# Patient Record
Sex: Female | Born: 1975
Health system: Southern US, Community
[De-identification: ages and names within clinical notes are randomized; demographics above are authoritative.]

## PROBLEM LIST (undated history)

## (undated) DIAGNOSIS — K900 Celiac disease: Secondary | ICD-10-CM

## (undated) HISTORY — PX: ABDOMINAL HYSTERECTOMY: SHX81

## (undated) HISTORY — DX: Celiac disease: K90.0

---

## 2012-04-19 ENCOUNTER — Ambulatory Visit (INDEPENDENT_AMBULATORY_CARE_PROVIDER_SITE_OTHER): Payer: 59 | Admitting: Physician Assistant

## 2012-04-19 VITALS — BP 110/76 | HR 77 | Temp 98.7°F | Resp 16 | Ht 65.0 in | Wt 140.4 lb

## 2012-04-19 DIAGNOSIS — L282 Other prurigo: Secondary | ICD-10-CM

## 2012-04-19 DIAGNOSIS — B379 Candidiasis, unspecified: Secondary | ICD-10-CM

## 2012-04-19 LAB — POCT SKIN KOH: Skin KOH, POC: POSITIVE

## 2012-04-19 MED ORDER — NYSTATIN 100000 UNIT/GM EX POWD: Freq: Four times a day (QID) | CUTANEOUS | Status: AC

## 2012-04-19 NOTE — Progress Notes (Signed)
  Subjective:    Patient ID: Angela May, female    DOB: April 06, 1976, 36 y.o.   MRN: [id number]  HPI Patient presents with 10 days of pruritic rash under breasts and around waist line. Describes as red, splotchy, extremely pruritic, and worsening. She has been using hydrocortisone cream which did not help so she switched to benadryl cream and oral benadryl at night. These have done nothing to help her rash. It is spreading around to her back along her bra line as well as her waist line.  No warmth, drainage, or tenderness.     Review of Systems  All other systems reviewed and are negative.       Objective:   Physical Exam  Constitutional: She is oriented to person, place, and time. She appears well-developed and well-nourished.  HENT:  Head: Normocephalic and atraumatic.  Right Ear: External ear normal.  Left Ear: External ear normal.  Mouth/Throat: No oropharyngeal exudate.  Eyes: Conjunctivae are normal.  Neck: Normal range of motion.  Neurological: She is alert and oriented to person, place, and time.  Skin:     Psychiatric: She has a normal mood and affect. Her behavior is normal. Judgment and thought content normal.    Results for orders placed in visit on 04/19/12  POCT SKIN KOH      Component Value Range   Skin KOH, POC Positive           Assessment & Plan:   1. Candidiasis  Nystatin powder bid-tid x 2 weeks Call if no improvement in 3-4 days and can switch to clotrimazole cream.    2. Pruritic rash  POCT Skin KOH

## 2012-04-22 ENCOUNTER — Telehealth: Payer: Self-pay

## 2012-04-22 MED ORDER — CLOTRIMAZOLE 1 % EX CREA: TOPICAL_CREAM | Freq: Two times a day (BID) | CUTANEOUS | Status: AC

## 2012-04-22 NOTE — Telephone Encounter (Signed)
LMOM to pick up RX from pharmacy. 

## 2012-04-22 NOTE — Telephone Encounter (Signed)
Rx sent to pharmacy   

## 2012-04-22 NOTE — Telephone Encounter (Signed)
PATIENT STATES SHE SAW HEATHER ON Tuesday FOR A YEAST RASH. SHE GAVE HER A POWDER TO USE AND SAID IF THAT DID NOT WORK TO CALL HER BACK AND SHE WOULD PRESCRIBE HER A CREAM. SHE WOULD LIKE TO HAVE THAT CALLED INTO THE PHARMACY BECAUSE THE POWDER IS NOT HELPING HER ITCH AT ALL. BEST PHONE [phone] (CELL)  PHARMACY CHOICE IS [facility] IN [place] OFF OF [address].   MBC

## 2015-10-24 ENCOUNTER — Ambulatory Visit (INDEPENDENT_AMBULATORY_CARE_PROVIDER_SITE_OTHER): Payer: BLUE CROSS/BLUE SHIELD | Admitting: Emergency Medicine

## 2015-10-24 VITALS — BP 108/78 | HR 88 | Temp 98.1°F | Resp 16 | Ht 69.0 in | Wt 148.0 lb

## 2015-10-24 DIAGNOSIS — B085 Enteroviral vesicular pharyngitis: Secondary | ICD-10-CM | POA: Diagnosis not present

## 2015-10-24 DIAGNOSIS — J209 Acute bronchitis, unspecified: Secondary | ICD-10-CM | POA: Diagnosis not present

## 2015-10-24 MED ORDER — AMOXICILLIN-POT CLAVULANATE 875-125 MG PO TABS: 1.0000 | ORAL_TABLET | Freq: Two times a day (BID) | ORAL | Status: DC

## 2015-10-24 MED ORDER — HYDROCOD POLST-CPM POLST ER 10-8 MG/5ML PO SUER: 5.0000 mL | Freq: Two times a day (BID) | ORAL | Status: DC

## 2015-10-24 NOTE — Progress Notes (Signed)
Subjective:  Patient ID: Angela May, female    DOB: [birth year]  Age: 40 y.o. MRN: [id number]  CC: Sore Throat   HPI Angela May presents  patient has a sore throat cough. She has no fever or chills. She has no nasal congestion postnasal drainage. She has a cough that's productive purulent sputum. She's had a fever at times at home denies any nausea vomiting. No wheezing or shortness of breath. She had no improvement with over-the-counter medication.  History Angela May has no past medical history on file.   She has past surgical history that includes Abdominal hysterectomy.   Her  family history is not on file.  She   reports that she has never smoked. She does not have any smokeless tobacco history on file. Her alcohol and drug histories are not on file.  Outpatient Prescriptions Prior to Visit  Medication Sig Dispense Refill  . amitriptyline (ELAVIL) 10 MG tablet Take 10 mg by mouth at bedtime.    . topiramate (TOPAMAX) 25 MG tablet Take 25 mg by mouth 2 (two) times daily.     No facility-administered medications prior to visit.    Social History   Social History  . Marital Status: Divorced    Spouse Name: N/A  . Number of Children: N/A  . Years of Education: N/A   Social History Main Topics  . Smoking status: Never Smoker   . Smokeless tobacco: None  . Alcohol Use: None  . Drug Use: None  . Sexual Activity: Not Asked   Other Topics Concern  . None   Social History Narrative     Review of Systems  Constitutional: Negative for fever, chills and appetite change.  HENT: Positive for sore throat. Negative for congestion, ear pain, postnasal drip and sinus pressure.   Eyes: Negative for pain and redness.  Respiratory: Positive for cough. Negative for shortness of breath and wheezing.   Cardiovascular: Negative for leg swelling.  Gastrointestinal: Negative for nausea, vomiting, abdominal pain, diarrhea, constipation and blood in stool.  Endocrine: Negative for  polyuria.  Genitourinary: Negative for dysuria, urgency, frequency and flank pain.  Musculoskeletal: Negative for gait problem.  Skin: Negative for rash.  Neurological: Negative for weakness and headaches.  Psychiatric/Behavioral: Negative for confusion and decreased concentration. The patient is not nervous/anxious.     Objective:  BP 108/78 mmHg  Pulse 88  Temp(Src) 98.1 F (36.7 C) (Oral)  Resp 16  Ht 5\' 9"  (1.753 m)  Wt 148 lb (67.132 kg)  BMI 21.85 kg/m2  SpO2 99%  Physical Exam  Constitutional: She is oriented to person, place, and time. She appears well-developed and well-nourished. No distress.  HENT:  Head: Normocephalic and atraumatic.  Right Ear: External ear normal.  Left Ear: External ear normal.  Nose: Nose normal.  She has multiple small discrete erythematous lesions on back or throat there is no generalized erythema. She has no exudate or tonsillar enlargement.  Eyes: Conjunctivae and EOM are normal. Pupils are equal, round, and reactive to light. No scleral icterus.  Neck: Normal range of motion. Neck supple. No tracheal deviation present.  Cardiovascular: Normal rate, regular rhythm and normal heart sounds.   Pulmonary/Chest: Effort normal. No respiratory distress. She has no wheezes. She has no rales.  Abdominal: She exhibits no mass. There is no tenderness. There is no rebound and no guarding.  Musculoskeletal: She exhibits no edema.  Lymphadenopathy:    She has no cervical adenopathy.  Neurological: She is alert and oriented to person,  place, and time. Coordination normal.  Skin: Skin is warm and dry. No rash noted.  Psychiatric: She has a normal mood and affect. Her behavior is normal.      Assessment & Plan:   Angela May was seen today for sore throat.  Diagnoses and all orders for this visit:  Acute bronchitis, unspecified organism  Acute pharyngitis due to coxsackie virus  Other orders -     chlorpheniramine-HYDROcodone (TUSSIONEX PENNKINETIC  ER) 10-8 MG/5ML SUER; Take 5 mLs by mouth 2 (two) times daily. -     amoxicillin-clavulanate (AUGMENTIN) 875-125 MG tablet; Take 1 tablet by mouth 2 (two) times daily.   I am having Angela May start on chlorpheniramine-HYDROcodone and amoxicillin-clavulanate. I am also having her maintain her topiramate, amitriptyline, and LORazepam.  Meds ordered this encounter  Medications  . LORazepam (ATIVAN) 0.5 MG tablet    Sig: Take 0.5 mg by mouth every 8 (eight) hours.  . chlorpheniramine-HYDROcodone (TUSSIONEX PENNKINETIC ER) 10-8 MG/5ML SUER    Sig: Take 5 mLs by mouth 2 (two) times daily.    Dispense:  60 mL    Refill:  0  . amoxicillin-clavulanate (AUGMENTIN) 875-125 MG tablet    Sig: Take 1 tablet by mouth 2 (two) times daily.    Dispense:  20 tablet    Refill:  0    Appropriate red flag conditions were discussed with the patient as well as actions that should be taken.  Patient expressed his understanding.  Follow-up: Return if symptoms worsen or fail to improve.  Angela May, Janice Seales S, MD

## 2015-10-24 NOTE — Patient Instructions (Signed)

## 2015-11-01 ENCOUNTER — Telehealth: Payer: Self-pay

## 2015-11-01 MED ORDER — FLUCONAZOLE 150 MG PO TABS: 150.0000 mg | ORAL_TABLET | Freq: Once | ORAL | Status: DC

## 2015-11-01 NOTE — Telephone Encounter (Signed)
Yes.  150 mg once.  #2. Repeat in three days if necessary.  Deliah BostonMichael Dakia Schifano, MS, PA-C 11:19 AM, 11/01/2015

## 2015-11-01 NOTE — Telephone Encounter (Signed)
Pt was prescribed Augmentin on 10/24/15 by Dr. Dareen PianoAnderson. Pt says she now has a yeast infection. Pt wondering if we can send in Diflucan?

## 2015-11-01 NOTE — Telephone Encounter (Signed)
Thank you. Will send in and inform pt medication has been sent.

## 2015-11-05 ENCOUNTER — Telehealth: Payer: Self-pay | Admitting: Family Medicine

## 2015-11-05 NOTE — Telephone Encounter (Signed)
Patient called, was seen 10/24/15 dx with acute bronchitis and prescribed augmentin. She is now able to swallow and her throat is no longer sore, other than that she is not any better. Having chills, cough/non-productive, fever (101), and "dont feel good". She has also started having body aches. Advised RTC. She stated she will come in the am 11/06/15.

## 2015-11-06 ENCOUNTER — Ambulatory Visit (INDEPENDENT_AMBULATORY_CARE_PROVIDER_SITE_OTHER): Payer: BLUE CROSS/BLUE SHIELD

## 2015-11-06 ENCOUNTER — Ambulatory Visit (INDEPENDENT_AMBULATORY_CARE_PROVIDER_SITE_OTHER): Payer: BLUE CROSS/BLUE SHIELD | Admitting: Urgent Care

## 2015-11-06 ENCOUNTER — Telehealth: Payer: Self-pay | Admitting: Family Medicine

## 2015-11-06 VITALS — BP 102/68 | HR 83 | Temp 98.6°F | Resp 16 | Ht 69.0 in | Wt 146.0 lb

## 2015-11-06 DIAGNOSIS — J209 Acute bronchitis, unspecified: Secondary | ICD-10-CM

## 2015-11-06 DIAGNOSIS — R062 Wheezing: Secondary | ICD-10-CM

## 2015-11-06 DIAGNOSIS — R059 Cough, unspecified: Secondary | ICD-10-CM

## 2015-11-06 DIAGNOSIS — R05 Cough: Secondary | ICD-10-CM

## 2015-11-06 MED ORDER — AZITHROMYCIN 250 MG PO TABS: ORAL_TABLET | ORAL | Status: DC

## 2015-11-06 MED ORDER — BENZONATATE 100 MG PO CAPS: 100.0000 mg | ORAL_CAPSULE | Freq: Three times a day (TID) | ORAL | Status: DC | PRN

## 2015-11-06 MED ORDER — HYDROCOD POLST-CPM POLST ER 10-8 MG/5ML PO SUER: 5.0000 mL | Freq: Every evening | ORAL | Status: DC | PRN

## 2015-11-06 MED ORDER — FLUCONAZOLE 150 MG PO TABS: 150.0000 mg | ORAL_TABLET | Freq: Once | ORAL | Status: DC

## 2015-11-06 MED ORDER — ALBUTEROL SULFATE HFA 108 (90 BASE) MCG/ACT IN AERS: 2.0000 | INHALATION_SPRAY | Freq: Four times a day (QID) | RESPIRATORY_TRACT | Status: DC | PRN

## 2015-11-06 NOTE — Telephone Encounter (Signed)
Patient notified by phone, see results note from 11/06/2015.

## 2015-11-06 NOTE — Patient Instructions (Signed)

## 2015-11-06 NOTE — Telephone Encounter (Signed)
Erskine Squibb with [facility] called report for CXR. See report.  Lingula versus anterior basal segment left lower lobe airspace opacity compatible with bronchopneumonia in this setting. No pleural effusion.  Followup PA and lateral chest X-ray is recommended in 3-4 weeks following trial of antibiotic therapy to ensure resolution and exclude underlying malignancy.  These results will be called to the ordering clinician or representative by the Radiologist Assistant, and communication documented in the PACS or zVision Dashboard.

## 2015-11-06 NOTE — Progress Notes (Signed)
    MRN: [id number] DOB: 07/09/1976  Subjective:   Angela May is a 40 y.o. female presenting for chief complaint of Follow-up; Fever; Fatigue; and Chills  Reports 3 week history of persistent cough, fatigue, chills, fever (highest 100.17F). Patient was seen 10/24/2015 for similar symptoms and sore throat. She was diagnosed with pharyngitis and bronchitis, started on Augmentin and Tussionex. She has since had improvement in her sore throat but is worried that her symptoms have not yet resolved. 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 Denies chest pain, shob, n/v, abdominal pain, body aches. Admits history of seasonal allergies, is not compliant with her Claritin and Zyrtec. Denies history of asthma. Denies smoking cigarettes.  Quentina has a current medication list which includes the following prescription(s): amitriptyline, lorazepam, topiramate, amoxicillin-clavulanate, chlorpheniramine-hydrocodone, and fluconazole. Also has No Known Allergies.  Eliyanah  has no past medical history on file. Also  has past surgical history that includes Abdominal hysterectomy.  Objective:   Vitals: BP 102/68 mmHg  Pulse 83  Temp(Src) 98.6 F (37 C) (Oral)  Resp 16  Ht  (1.753 m)  Wt 146 lb (66.225 kg)  BMI 21.55 kg/m2  SpO2 98%  Physical Exam  Constitutional: She is oriented to person, place, and time. She appears well-developed and well-nourished.  HENT:  TM's intact bilaterally, no effusions or erythema. Nasal turbinates pink and moist. No sinus tenderness. Mild postnasal drip present, without oropharyngeal exudates, erythema or abscesses.  Eyes: Right eye exhibits no discharge. Left eye exhibits no discharge. No scleral icterus.  Neck: Normal range of motion. Neck supple.  Cardiovascular: Normal rate, regular rhythm and intact distal pulses.  Exam reveals no gallop and no friction rub.   No murmur heard. Pulmonary/Chest: No respiratory distress. She has wheezes  (bibasilar wheezing). She has no rales.  Lymphadenopathy:    She has no cervical adenopathy.  Neurological: She is alert and oriented to person, place, and time.  Skin: Skin is warm and dry.   [facility] reading (PRIMARY) by PA-Daysean Tinkham. Chest - possible increased marking in right lower field, please comment.  Assessment and Plan :   1. Acute bronchitis, unspecified organism 2. Cough 3. Wheezing - Radiology over-read pending. Will cover patient for infectious process with Azithromycin, refilled Tussionex. Also provided with albuterol and Tessalon. Diflucan for antibiotic associated yeast infection. RTC in 2 weeks if no improvement.  Wallis Bamberg, PA-C [facility] [phone] 11/06/2015 11:02 AM

## 2016-06-08 ENCOUNTER — Encounter: Payer: Self-pay | Admitting: Gastroenterology

## 2016-08-17 ENCOUNTER — Encounter: Payer: Self-pay | Admitting: Gastroenterology

## 2016-08-17 ENCOUNTER — Ambulatory Visit (INDEPENDENT_AMBULATORY_CARE_PROVIDER_SITE_OTHER): Payer: BLUE CROSS/BLUE SHIELD | Admitting: Gastroenterology

## 2016-08-17 ENCOUNTER — Other Ambulatory Visit (INDEPENDENT_AMBULATORY_CARE_PROVIDER_SITE_OTHER): Payer: BLUE CROSS/BLUE SHIELD

## 2016-08-17 VITALS — BP 102/64 | HR 80 | Ht 68.0 in | Wt 143.4 lb

## 2016-08-17 DIAGNOSIS — K900 Celiac disease: Secondary | ICD-10-CM | POA: Diagnosis not present

## 2016-08-17 LAB — CBC WITH DIFFERENTIAL/PLATELET
BASOS ABS: 0 10*3/uL (ref 0.0–0.1)
BASOS PCT: 0.6 % (ref 0.0–3.0)
EOS ABS: 0 10*3/uL (ref 0.0–0.7)
Eosinophils Relative: 0.7 % (ref 0.0–5.0)
HEMATOCRIT: 45.3 % (ref 36.0–46.0)
Hemoglobin: 15.8 g/dL — ABNORMAL HIGH (ref 12.0–15.0)
LYMPHS ABS: 1.3 10*3/uL (ref 0.7–4.0)
LYMPHS PCT: 18.5 % (ref 12.0–46.0)
MCHC: 34.9 g/dL (ref 30.0–36.0)
MCV: 92.7 fl (ref 78.0–100.0)
Monocytes Absolute: 0.3 10*3/uL (ref 0.1–1.0)
Monocytes Relative: 3.5 % (ref 3.0–12.0)
NEUTROS ABS: 5.6 10*3/uL (ref 1.4–7.7)
NEUTROS PCT: 76.7 % (ref 43.0–77.0)
PLATELETS: 233 10*3/uL (ref 150.0–400.0)
RBC: 4.89 Mil/uL (ref 3.87–5.11)
RDW: 11.9 % (ref 11.5–15.5)
WBC: 7.3 10*3/uL (ref 4.0–10.5)

## 2016-08-17 LAB — COMPREHENSIVE METABOLIC PANEL
ALT: 14 U/L (ref 0–35)
AST: 13 U/L (ref 0–37)
Albumin: 4.5 g/dL (ref 3.5–5.2)
Alkaline Phosphatase: 47 U/L (ref 39–117)
BUN: 13 mg/dL (ref 6–23)
CALCIUM: 9.6 mg/dL (ref 8.4–10.5)
CHLORIDE: 108 meq/L (ref 96–112)
CO2: 24 meq/L (ref 19–32)
Creatinine, Ser: 0.93 mg/dL (ref 0.40–1.20)
GFR: 71.41 mL/min (ref >60.00)
GLUCOSE: 103 mg/dL — AB (ref 70–99)
Potassium: 5.1 mEq/L (ref 3.5–5.1)
Sodium: 139 mEq/L (ref 135–145)
Total Bilirubin: 0.4 mg/dL (ref 0.2–1.2)
Total Protein: 7.5 g/dL (ref 6.0–8.3)

## 2016-08-17 LAB — IBC PANEL
Iron: 99 ug/dL (ref 42–145)
SATURATION RATIOS: 41.1 % (ref 20.0–50.0)
TRANSFERRIN: 172 mg/dL — AB (ref 212.0–360.0)

## 2016-08-17 LAB — VITAMIN D 25 HYDROXY (VIT D DEFICIENCY, FRACTURES): VITD: 36.95 ng/mL (ref 30.00–100.00)

## 2016-08-17 LAB — CALCIUM: CALCIUM: 9.6 mg/dL (ref 8.4–10.5)

## 2016-08-17 LAB — FERRITIN: FERRITIN: 46.8 ng/mL (ref 10.0–291.0)

## 2016-08-17 NOTE — Patient Instructions (Addendum)
We will get records sent from your previous gastroenterologist in [facility].  This will include any endoscopic (colonoscopy or upper endoscopy) procedures and any associated pathology reports.   You will have labs checked today in the basement lab.  Please head down after you check out with the front desk  (tTG level, cbc, cmet, vit D, calcium, ferritin, iron, tibc). Dietary referral for celiac sprue.  Please call [phone] if you have not heard from that office in 1 week.  Please return to see Dr. Christella HartiganJacobs in 6 months.  Please call to set up appointment in about 2 months. Start one imodium once every morning.

## 2016-08-17 NOTE — Progress Notes (Signed)
HPI: This is a    very pleasant 40 year old woman   who was [facility] to [facility] Braxton Feathersavvon.   Chief complaint is celiac sprue  [place]; and EGD and colonoscopy.  She's been having terrible abd pains, lower abd. With worse loose stools.  She tries be compliant with gluten free  Occasional beer.  Eats a lot of salads.  She will have 4-5 BMs daily, always loose.  Never bloody.  Overall stable weight.  Never had dietician   2-3 coffees daily.     Review of systems: Pertinent positive and negative review of systems were noted in the above HPI section. Complete review of systems was performed and was otherwise normal.   Past Medical History:  Diagnosis Date  . Celiac disease    DX 2009    Past Surgical History:  Procedure Laterality Date  . ABDOMINAL HYSTERECTOMY      Current Outpatient Prescriptions  Medication Sig Dispense Refill  . amitriptyline (ELAVIL) 10 MG tablet Take 10 mg by mouth at bedtime.    Marland Kitchen. LORazepam (ATIVAN) 0.5 MG tablet Take 0.5 mg by mouth every 8 (eight) hours. Reported on 11/06/2015    . topiramate (TOPAMAX) 25 MG tablet Take 25 mg by mouth 2 (two) times daily.     No current facility-administered medications for this visit.     Allergies as of 08/17/2016  . (No Known Allergies)    Family History  Problem Relation Age of Onset  . Colon cancer Neg Hx   . Stomach cancer Neg Hx     Social History   Social History  . Marital status: Divorced    Spouse name: N/A  . Number of children: N/A  . Years of education: N/A   Occupational History  . Not on file.   Social History Main Topics  . Smoking status: Never Smoker  . Smokeless tobacco: Not on file  . Alcohol use Not on file  . Drug use: Unknown  . Sexual activity: Not on file   Other Topics Concern  . Not on file   Social History Narrative  . No narrative on file     Physical Exam: Ht 5\' 8"  (1.727 m)   Wt 143  lb 6 oz (65 kg)   BMI 21.80 kg/m  Constitutional: generally well-appearing Psychiatric: alert and oriented x3 Eyes: extraocular movements intact Mouth: oral pharynx moist, no lesions Neck: supple no lymphadenopathy Cardiovascular: heart regular rate and rhythm Lungs: clear to auscultation bilaterally Abdomen: soft, nontender, nondistended, no obvious ascites, no peritoneal signs, normal bowel sounds Extremities: no lower extremity edema bilaterally Skin: no lesions on visible extremities   Assessment and plan: 40 y.o. female with  previously diagnosed celiac sprue  Chronic loose stools, intermittent lower abdominal pains. She knows she is not was compliant with gluten-free diet but I suspect some gluten is still getting through even when she tries to avoid it. She is going to get a basic set of labs today including TTg levels, iron studies, vitamin D calcium levels.   She has never set down the dietitian formally and we are going to arrange for that referral. She will return to see me in 6 months and sooner if needed. I see no reason for any endoscopic testing. We will send away for her upper endoscopy, colonoscopy and associated pathology reports from [facility] where she was originally diagnosed with the celiac sprue.   Rob Buntinganiel Aydn Ferrara, MD [facility] 08/17/2016, 8:32 AM  Cc: Olivia Mackieichard Taavon, MD

## 2016-08-18 LAB — TISSUE TRANSGLUTAMINASE, IGA: Tissue Transglutaminase Ab, IgA: 1 U/mL (ref <4)

## 2016-08-20 ENCOUNTER — Other Ambulatory Visit: Payer: Self-pay

## 2016-08-20 DIAGNOSIS — K909 Intestinal malabsorption, unspecified: Secondary | ICD-10-CM

## 2016-08-25 ENCOUNTER — Other Ambulatory Visit (INDEPENDENT_AMBULATORY_CARE_PROVIDER_SITE_OTHER): Payer: BLUE CROSS/BLUE SHIELD

## 2016-08-25 DIAGNOSIS — K909 Intestinal malabsorption, unspecified: Secondary | ICD-10-CM | POA: Diagnosis not present

## 2016-08-25 LAB — IGA: IGA: 240 mg/dL (ref 68–378)

## 2016-08-27 LAB — CELIAC PANEL 10
ANTIGLIADIN ABS, IGA: 33 U — AB (ref 0–19)
Endomysial IgA: NEGATIVE
Gliadin IgG: 37 units — ABNORMAL HIGH (ref 0–19)
IgA/Immunoglobulin A, Serum: 237 mg/dL (ref 87–352)
Tissue Transglut Ab: <2 U/mL (ref 0–5)
Transglutaminase IgA: <2 U/mL (ref 0–3)

## 2016-09-03 ENCOUNTER — Other Ambulatory Visit: Payer: Self-pay

## 2016-09-03 MED ORDER — BUDESONIDE 3 MG PO CPEP: 9.0000 mg | ORAL_CAPSULE | Freq: Every day | ORAL | 3 refills | Status: AC

## 2016-10-20 ENCOUNTER — Ambulatory Visit: Payer: BLUE CROSS/BLUE SHIELD | Admitting: Gastroenterology

## 2016-12-14 ENCOUNTER — Ambulatory Visit (INDEPENDENT_AMBULATORY_CARE_PROVIDER_SITE_OTHER): Payer: BLUE CROSS/BLUE SHIELD | Admitting: Physician Assistant

## 2016-12-14 VITALS — BP 108/70 | HR 74 | Temp 98.9°F | Resp 18 | Ht 68.0 in | Wt 144.2 lb

## 2016-12-14 DIAGNOSIS — K900 Celiac disease: Secondary | ICD-10-CM | POA: Insufficient documentation

## 2016-12-14 DIAGNOSIS — J209 Acute bronchitis, unspecified: Secondary | ICD-10-CM | POA: Diagnosis not present

## 2016-12-14 MED ORDER — IPRATROPIUM BROMIDE 0.03 % NA SOLN: 2.0000 | Freq: Two times a day (BID) | NASAL | 0 refills | Status: AC

## 2016-12-14 MED ORDER — GUAIFENESIN ER 1200 MG PO TB12: 1.0000 | ORAL_TABLET | Freq: Two times a day (BID) | ORAL | 1 refills | Status: AC | PRN

## 2016-12-14 MED ORDER — BENZONATATE 100 MG PO CAPS: 100.0000 mg | ORAL_CAPSULE | Freq: Three times a day (TID) | ORAL | 0 refills | Status: AC | PRN

## 2016-12-14 MED ORDER — ALBUTEROL SULFATE HFA 108 (90 BASE) MCG/ACT IN AERS: 2.0000 | INHALATION_SPRAY | RESPIRATORY_TRACT | 1 refills | Status: AC | PRN

## 2016-12-14 NOTE — Patient Instructions (Addendum)
Bronchitis is typically VIRAL. However, bacterial infections can follow viral illness, so if you are not improving, or if your symptoms worsen, please return for re-evaluation.    IF you received an x-ray today, you will receive an invoice from [facility]. Please contact [facility] at [phone] with questions or concerns regarding your invoice.   IF you received labwork today, you will receive an invoice from [facility]. Please contact [facility] at [phone] with questions or concerns regarding your invoice.   Our billing staff will not be able to assist you with questions regarding bills from these companies.  You will be contacted with the lab results as soon as they are available. The fastest way to get your results is to activate your My Chart account. Instructions are located on the last page of this paperwork. If you have not heard from us regarding the results in 2 weeks, please contact this office.     Acute Bronchitis, Adult Acute bronchitis is when air tubes (bronchi) in the lungs suddenly get swollen. The condition can make it hard to breathe. It can also cause these symptoms:  A cough.  Coughing up clear, yellow, or green mucus.  Wheezing.  Chest congestion.  Shortness of breath.  A fever.  Body aches.  Chills.  A sore throat. Follow these instructions at home: Medicines  Take over-the-counter and prescription medicines only as told by your doctor.  If you were prescribed an antibiotic medicine, take it as told by your doctor. Do not stop taking the antibiotic even if you start to feel better. General instructions  Rest.  Drink enough fluids to keep your pee (urine) clear or pale yellow.  Avoid smoking and secondhand smoke. If you smoke and you need help quitting, ask your doctor. Quitting will help your lungs heal faster.  Use an inhaler, cool mist vaporizer, or humidifier as told by your doctor.  Keep all follow-up visits as told  by your doctor. This is important. How is this prevented? To lower your risk of getting this condition again:  Wash your hands often with soap and water. If you cannot use soap and water, use hand sanitizer.  Avoid contact with people who have cold symptoms.  Try not to touch your hands to your mouth, nose, or eyes.  Make sure to get the flu shot every year. Contact a doctor if:  Your symptoms do not get better in 2 weeks. Get help right away if:  You cough up blood.  You have chest pain.  You have very bad shortness of breath.  You become dehydrated.  You faint (pass out) or keep feeling like you are going to pass out.  You keep throwing up (vomiting).  You have a very bad headache.  Your fever or chills gets worse. This information is not intended to replace advice given to you by your health care provider. Make sure you discuss any questions you have with your health care provider. Document Released: 03/23/2008 Document Revised: 05/13/2016 Document Reviewed: 03/25/2016 Elsevier Interactive Patient Education  2017 Elsevier Inc.   Antibiotics Aren't Always The Answer  CDC Urges Public To Be Antibiotics Aware The Centers for Disease Control and Prevention (CDC) encourages patients and families to Be Antibiotics Aware by learning about safe antibiotic use. Each year in the [place], at least 2 million people get infected with antibiotic-resistant bacteria. At least 23,000 die as a result. Antibiotic resistance, one of the most urgent threats to the public's health, occurs when bacteria develop  the ability to defeat the drugs designed to kill them.  What Do Antibiotics Treat? When you get a prescription for antibiotics, follow your doctor's instructions carefully.  Antibiotics are critical tools for treating a number of common infections, such as pneumonia, and for life-threatening conditions including sepsis. Antibiotics are only needed for treating certain infections  caused by bacteria.  What Don't Antibiotics Treat? Antibiotics do not work on viruses, such as colds and flu, or runny noses, even if the mucus is thick, yellow or green. Antibiotics also won't help some common bacterial infections including most cases of bronchitis, many sinus infections, and some ear infections.  What Are The Side Effects of Antibiotics? Any time antibiotics are used, they can cause side effects and lead to antibiotic resistance. When antibiotics aren't needed, they won't help you, and the side effects could still hurt you. Common side effects range from things like rashes and yeast infections to severe health problems. More serious side effects include Clostridium difficile infection (also called C. difficile or C. diff), which causes diarrhea that can lead to severe colon damage and death. If you need antibiotics, take them exactly as prescribed. Patients and families can talk to their healthcare professional if they have any questions about their antibiotics, or if they develop side effects, especially diarrhea, since that could be C. difficile, which needs to be treated.  Can I Feel Better Without Antibiotics? Patients and families can ask their healthcare professional about the best way to feel better while their body fights off the virus. Respiratory viruses usually go away in a week or two without treatment.  How Can I Stay Healthy? Regular hand-washing can go a long way toward protecting you from germs.  We can all stay healthy and keep others healthy by cleaning our hands, covering our coughs, staying home when sick, and getting recommended vaccines, for the flu, for example. Antibiotics save lives. When a patient needs antibiotics, the benefits outweigh the risks of side effects and antibiotic resistance. Improving the way we take antibiotics helps keep [place] healthy now, helps fight antibiotic resistance, and ensures that life-saving antibiotics will be available for future  generations.  To learn more about antibiotic prescribing and use, visit [email or web address]

## 2016-12-14 NOTE — Progress Notes (Signed)
Patient ID: Angela May, female    DOB: 01-Nov-1975, 41 y.o.   MRN: [id number]  PCP: Angela FillersPATERSON,DANIEL G, MD  Chief Complaint  Patient presents with  . Cough  . chest tightness    Subjective:   Presents for evaluation of cough and chest tightness x 3 days.  Using cetirizine per usual. Tessalon perles with some relief.  Concerned re: previous pneumonia 10/2015  No fever or chills. No sore throat. Some nasal congestion. No headaches or dizziness. No nausea, vomiting or diarrhea. Celiac disease is currently stable.   Review of Systems As above.    Patient Active Problem List   Diagnosis Date Noted  . Celiac disease 12/14/2016     Prior to Admission medications   Medication Sig Start Date End Date Taking? Authorizing Provider  LORazepam (ATIVAN) 0.5 MG tablet Take 0.5 mg by mouth every 8 (eight) hours. Reported on 11/06/2015   Yes Historical Provider, MD  topiramate (TOPAMAX) 25 MG tablet Take 25 mg by mouth 2 (two) times daily.   Yes Historical Provider, MD  amitriptyline (ELAVIL) 10 MG tablet Take 10 mg by mouth at bedtime.   NO Historical Provider, MD  budesonide (ENTOCORT EC) 3 MG 24 hr capsule Take 3 capsules (9 mg total) by mouth daily. Patient not taking: Reported on 12/14/2016 09/03/16  YES Rachael Feeaniel P Jacobs, MD     No Known Allergies     Objective:  Physical Exam  Constitutional: She is oriented to person, place, and time. She appears well-developed and well-nourished. She is active and cooperative. No distress.  BP 108/70 (BP Location: Right Arm, Patient Position: Sitting, Cuff Size: Small)   Pulse 74   Temp 98.9 F (37.2 C) (Oral)   Resp 18   Ht 5\' 8"  (1.727 m)   Wt 144 lb 3.2 oz (65.4 kg)   SpO2 100%   BMI 21.93 kg/m   HENT:  Head: Normocephalic and atraumatic.  Right Ear: Hearing, tympanic membrane, external ear and ear canal normal.  Left Ear: Hearing, tympanic membrane, external ear and ear canal normal.  Nose: Nose normal.  Mouth/Throat:  Oropharynx is clear and moist and mucous membranes are normal.  Eyes: Conjunctivae are normal. No scleral icterus.  Neck: Normal range of motion. Neck supple. No thyromegaly present.  Cardiovascular: Normal rate, regular rhythm and normal heart sounds.   Pulses:      Radial pulses are 2+ on the right side, and 2+ on the left side.  Pulmonary/Chest: Effort normal and breath sounds normal.  Lymphadenopathy:       Head (right side): No tonsillar, no preauricular, no posterior auricular and no occipital adenopathy present.       Head (left side): No tonsillar, no preauricular, no posterior auricular and no occipital adenopathy present.    She has no cervical adenopathy.       Right: No supraclavicular adenopathy present.       Left: No supraclavicular adenopathy present.  Neurological: She is alert and oriented to person, place, and time. No sensory deficit.  Skin: Skin is warm, dry and intact. No rash noted. No cyanosis or erythema. Nails show no clubbing.  Psychiatric: She has a normal mood and affect. Her speech is normal and behavior is normal.           Assessment & Plan:   1. Acute bronchitis, unspecified organism Supportive care.  Anticipatory guidance.  RTC if symptoms worsen/persist. - albuterol (PROVENTIL HFA;VENTOLIN HFA) 108 (90 Base) MCG/ACT inhaler; Inhale 2  puffs into the lungs every 4 (four) hours as needed for wheezing or shortness of breath (cough, shortness of breath or wheezing.).  Dispense: 1 Inhaler; Refill: 1 - ipratropium (ATROVENT) 0.03 % nasal spray; Place 2 sprays into both nostrils 2 (two) times daily.  Dispense: 30 mL; Refill: 0 - benzonatate (TESSALON) 100 MG capsule; Take 1-2 capsules (100-200 mg total) by mouth 3 (three) times daily as needed for cough.  Dispense: 40 capsule; Refill: 0 - Guaifenesin (MUCINEX MAXIMUM STRENGTH) 1200 MG TB12; Take 1 tablet (1,200 mg total) by mouth every 12 (twelve) hours as needed.  Dispense: 14 tablet; Refill: 1   Fernande Bras, PA-C Physician Assistant-Certified Primary Care at [facility]

## 2017-05-10 ENCOUNTER — Other Ambulatory Visit: Payer: Self-pay | Admitting: Obstetrics and Gynecology

## 2017-05-10 NOTE — Progress Notes (Unsigned)
Cancelled Rx for Lo loestrin Fe 24.

## 2017-05-13 ENCOUNTER — Other Ambulatory Visit: Payer: Self-pay | Admitting: Advanced Practice Midwife

## 2017-05-14 MED ORDER — NORETHIN-ETH ESTRAD-FE BIPHAS 1 MG-10 MCG / 10 MCG PO TABS: 1.0000 | ORAL_TABLET | Freq: Every day | ORAL | 4 refills | Status: AC

## 2017-09-17 ENCOUNTER — Ambulatory Visit: Payer: BLUE CROSS/BLUE SHIELD | Admitting: Physician Assistant

## 2018-01-18 ENCOUNTER — Encounter: Payer: Self-pay | Admitting: Physician Assistant

## 2018-01-24 DIAGNOSIS — Z111 Encounter for screening for respiratory tuberculosis: Secondary | ICD-10-CM | POA: Diagnosis not present

## 2018-05-14 DIAGNOSIS — S0181XA Laceration without foreign body of other part of head, initial encounter: Secondary | ICD-10-CM | POA: Diagnosis not present

## 2018-05-14 DIAGNOSIS — W500XXA Accidental hit or strike by another person, initial encounter: Secondary | ICD-10-CM | POA: Diagnosis not present

## 2018-05-19 ENCOUNTER — Other Ambulatory Visit: Payer: Self-pay | Admitting: Obstetrics

## 2018-05-20 DIAGNOSIS — Z4802 Encounter for removal of sutures: Secondary | ICD-10-CM | POA: Diagnosis not present

## 2018-09-02 DIAGNOSIS — F418 Other specified anxiety disorders: Secondary | ICD-10-CM | POA: Diagnosis not present

## 2018-09-02 DIAGNOSIS — K900 Celiac disease: Secondary | ICD-10-CM | POA: Diagnosis not present

## 2018-09-02 DIAGNOSIS — Z1389 Encounter for screening for other disorder: Secondary | ICD-10-CM | POA: Diagnosis not present

## 2018-09-02 DIAGNOSIS — G43909 Migraine, unspecified, not intractable, without status migrainosus: Secondary | ICD-10-CM | POA: Diagnosis not present

## 2018-12-22 DIAGNOSIS — Z6821 Body mass index (BMI) 21.0-21.9, adult: Secondary | ICD-10-CM | POA: Diagnosis not present

## 2018-12-22 DIAGNOSIS — Z1231 Encounter for screening mammogram for malignant neoplasm of breast: Secondary | ICD-10-CM | POA: Diagnosis not present

## 2018-12-22 DIAGNOSIS — Z01419 Encounter for gynecological examination (general) (routine) without abnormal findings: Secondary | ICD-10-CM | POA: Diagnosis not present

## 2019-04-03 DIAGNOSIS — Z111 Encounter for screening for respiratory tuberculosis: Secondary | ICD-10-CM | POA: Diagnosis not present

## 2019-07-31 DIAGNOSIS — M542 Cervicalgia: Secondary | ICD-10-CM | POA: Diagnosis not present

## 2019-08-14 DIAGNOSIS — M542 Cervicalgia: Secondary | ICD-10-CM | POA: Diagnosis not present

## 2019-08-14 DIAGNOSIS — M5412 Radiculopathy, cervical region: Secondary | ICD-10-CM | POA: Diagnosis not present

## 2019-08-18 DIAGNOSIS — Z79899 Other long term (current) drug therapy: Secondary | ICD-10-CM | POA: Diagnosis not present

## 2019-08-18 DIAGNOSIS — M859 Disorder of bone density and structure, unspecified: Secondary | ICD-10-CM | POA: Diagnosis not present

## 2019-08-18 DIAGNOSIS — Z0000 Encounter for general adult medical examination without abnormal findings: Secondary | ICD-10-CM | POA: Diagnosis not present

## 2019-08-25 DIAGNOSIS — M8580 Other specified disorders of bone density and structure, unspecified site: Secondary | ICD-10-CM | POA: Diagnosis not present

## 2019-08-25 DIAGNOSIS — K52832 Lymphocytic colitis: Secondary | ICD-10-CM | POA: Diagnosis not present

## 2019-08-25 DIAGNOSIS — M5412 Radiculopathy, cervical region: Secondary | ICD-10-CM | POA: Diagnosis not present

## 2019-08-25 DIAGNOSIS — Z0000 Encounter for general adult medical examination without abnormal findings: Secondary | ICD-10-CM | POA: Diagnosis not present

## 2019-08-25 DIAGNOSIS — M542 Cervicalgia: Secondary | ICD-10-CM | POA: Diagnosis not present

## 2019-08-25 DIAGNOSIS — Z1331 Encounter for screening for depression: Secondary | ICD-10-CM | POA: Diagnosis not present

## 2020-03-21 DIAGNOSIS — Z01419 Encounter for gynecological examination (general) (routine) without abnormal findings: Secondary | ICD-10-CM | POA: Diagnosis not present

## 2020-03-21 DIAGNOSIS — Z6823 Body mass index (BMI) 23.0-23.9, adult: Secondary | ICD-10-CM | POA: Diagnosis not present

## 2020-03-21 DIAGNOSIS — Z1231 Encounter for screening mammogram for malignant neoplasm of breast: Secondary | ICD-10-CM | POA: Diagnosis not present

## 2020-03-26 ENCOUNTER — Other Ambulatory Visit: Payer: Self-pay | Admitting: Obstetrics

## 2020-03-26 DIAGNOSIS — R928 Other abnormal and inconclusive findings on diagnostic imaging of breast: Secondary | ICD-10-CM

## 2020-04-03 ENCOUNTER — Other Ambulatory Visit: Payer: Self-pay

## 2020-04-03 ENCOUNTER — Ambulatory Visit
Admission: RE | Admit: 2020-04-03 | Discharge: 2020-04-03 | Disposition: A | Discharge status: Home / Self Care | Payer: BC Managed Care – PPO | Source: Ambulatory Visit | Attending: Obstetrics | Admitting: Obstetrics

## 2020-04-03 DIAGNOSIS — R922 Inconclusive mammogram: Secondary | ICD-10-CM | POA: Diagnosis not present

## 2020-04-03 DIAGNOSIS — R928 Other abnormal and inconclusive findings on diagnostic imaging of breast: Secondary | ICD-10-CM

## 2020-04-03 DIAGNOSIS — N6489 Other specified disorders of breast: Secondary | ICD-10-CM | POA: Diagnosis not present

## 2020-05-09 DIAGNOSIS — Z20822 Contact with and (suspected) exposure to covid-19: Secondary | ICD-10-CM | POA: Diagnosis not present

## 2020-05-09 DIAGNOSIS — Z03818 Encounter for observation for suspected exposure to other biological agents ruled out: Secondary | ICD-10-CM | POA: Diagnosis not present

## 2020-06-18 DIAGNOSIS — Z20822 Contact with and (suspected) exposure to covid-19: Secondary | ICD-10-CM | POA: Diagnosis not present

## 2020-07-08 DIAGNOSIS — R232 Flushing: Secondary | ICD-10-CM | POA: Diagnosis not present

## 2020-07-10 DIAGNOSIS — E2839 Other primary ovarian failure: Secondary | ICD-10-CM | POA: Diagnosis not present

## 2020-07-10 DIAGNOSIS — R232 Flushing: Secondary | ICD-10-CM | POA: Diagnosis not present

## 2020-07-10 DIAGNOSIS — M545 Low back pain: Secondary | ICD-10-CM | POA: Diagnosis not present

## 2020-07-16 DIAGNOSIS — E2839 Other primary ovarian failure: Secondary | ICD-10-CM | POA: Diagnosis not present

## 2020-07-31 DIAGNOSIS — R062 Wheezing: Secondary | ICD-10-CM | POA: Diagnosis not present

## 2020-07-31 DIAGNOSIS — R059 Cough, unspecified: Secondary | ICD-10-CM | POA: Diagnosis not present

## 2020-07-31 DIAGNOSIS — Z20822 Contact with and (suspected) exposure to covid-19: Secondary | ICD-10-CM | POA: Diagnosis not present

## 2020-07-31 DIAGNOSIS — R5383 Other fatigue: Secondary | ICD-10-CM | POA: Diagnosis not present

## 2020-09-03 DIAGNOSIS — F419 Anxiety disorder, unspecified: Secondary | ICD-10-CM | POA: Diagnosis not present

## 2020-09-03 DIAGNOSIS — Z0000 Encounter for general adult medical examination without abnormal findings: Secondary | ICD-10-CM | POA: Diagnosis not present

## 2020-09-03 DIAGNOSIS — Z23 Encounter for immunization: Secondary | ICD-10-CM | POA: Diagnosis not present

## 2020-11-08 DIAGNOSIS — J40 Bronchitis, not specified as acute or chronic: Secondary | ICD-10-CM | POA: Diagnosis not present

## 2020-11-08 DIAGNOSIS — R059 Cough, unspecified: Secondary | ICD-10-CM | POA: Diagnosis not present

## 2020-12-10 ENCOUNTER — Other Ambulatory Visit: Payer: Self-pay

## 2020-12-10 ENCOUNTER — Ambulatory Visit (INDEPENDENT_AMBULATORY_CARE_PROVIDER_SITE_OTHER): Payer: BC Managed Care – PPO | Admitting: Allergy

## 2020-12-10 ENCOUNTER — Encounter: Payer: Self-pay | Admitting: Allergy

## 2020-12-10 VITALS — BP 100/72 | HR 82 | Temp 97.6°F | Resp 18 | Ht 68.11 in | Wt 151.8 lb

## 2020-12-10 DIAGNOSIS — R059 Cough, unspecified: Secondary | ICD-10-CM

## 2020-12-10 DIAGNOSIS — H1013 Acute atopic conjunctivitis, bilateral: Secondary | ICD-10-CM

## 2020-12-10 DIAGNOSIS — J3089 Other allergic rhinitis: Secondary | ICD-10-CM | POA: Insufficient documentation

## 2020-12-10 MED ORDER — FLUTICASONE PROPIONATE 50 MCG/ACT NA SUSP
1.0000 | Freq: Two times a day (BID) | NASAL | 5 refills | Status: AC | PRN
Start: 2020-12-10 — End: ?

## 2020-12-10 MED ORDER — AZELASTINE HCL 0.05 % OP SOLN: 1.0000 [drp] | Freq: Two times a day (BID) | OPHTHALMIC | 5 refills | Status: AC | PRN

## 2020-12-10 NOTE — Assessment & Plan Note (Signed)
   See assessment and plan as above for allergic rhinitis.  

## 2020-12-10 NOTE — Patient Instructions (Addendum)
Today's skin testing showed: Positive to grass, ragweed, weed, trees, dust mites, cat, horse. Borderline to [place] pig.   Results given.  Environmental allergies  Start environmental control measures as below.  May use over the counter antihistamines such as Zyrtec (cetirizine), Claritin (loratadine), Allegra (fexofenadine), or Xyzal (levocetirizine) daily as needed. May take twice a day during flares.  Continue Singulair (montelukast) 10mg  daily at night. Cautioned that in some children/adults can experience behavioral changes including hyperactivity, agitation, depression, sleep disturbances and suicidal ideations. These side effects are rare, but if you notice them you should notify me and discontinue Singulair (montelukast).  Read about allergy injections - handout given.  May use Flonase (fluticasone) nasal spray 1 spray per nostril twice a day as needed for nasal congestion.   May use azelastine eye drops 0.05% twice a day as needed for itchy/watery eyes.  Breathing:  Today's breathing test was normal.   Monitor symptoms.  May use albuterol rescue inhaler 2 puffs or nebulizer every 4 to 6 hours as needed for shortness of breath, chest tightness, coughing, and wheezing. Monitor frequency of use.   Follow up in 2 months or sooner if needed and will finish the testing.   Pet Allergen Avoidance: . Contrary to popular opinion, there are no "hypoallergenic" breeds of dogs or cats. That is because people are not allergic to an animal's hair, but to an allergen found in the animal's saliva, dander (dead skin flakes) or urine. Pet allergy symptoms typically occur within minutes. For some people, symptoms can build up and become most severe 8 to 12 hours after contact with the animal. People with severe allergies can experience reactions in public places if dander has been transported on the pet owners' clothing. Keeping an animal outdoors is only a partial solution, since homes with  pets in the yard still have higher concentrations of animal allergens. . Before getting a pet, ask your allergist to determine if you are allergic to animals. If your pet is already considered part of your family, try to minimize contact and keep the pet out of the bedroom and other rooms where you spend a great deal of time. . As with dust mites, vacuum carpets often or replace carpet with a hardwood floor, tile or linoleum. . High-efficiency particulate air (HEPA) cleaners can reduce allergen levels over time. . While dander and saliva are the source of cat and dog allergens, urine is the source of allergens from rabbits, hamsters, mice and Marland Kitchen pigs; so ask a non-allergic family member to clean the animal's cage. . If you have a pet allergy, talk to your allergist about the potential for allergy immunotherapy (allergy shots). This strategy can often provide long-term relief. Reducing Pollen Exposure . Pollen seasons: trees (spring), grass (summer) and ragweed/weeds (fall). [birth year]1981 Keep windows closed in your home and car to lower pollen exposure.  Marland Kitchen air conditioning in the bedroom and throughout the house if possible.  . Avoid going out in dry windy days - especially early morning. . Pollen counts are highest between 5 - 10 AM and on dry, hot and windy days.  . Save outside activities for late afternoon or after a heavy rain, when pollen levels are lower.  . Avoid mowing of grass if you have grass pollen allergy. Lilian Kapur Be aware that pollen can also be transported indoors on people and pets.  . Dry your clothes in an automatic dryer rather than hanging them outside where they might collect pollen.  . Rinse  hair and eyes before bedtime. Control of House Dust Mite Allergen . Dust mite allergens are a common trigger of allergy and asthma symptoms. While they can be found throughout the house, these microscopic creatures thrive in warm, humid environments such as bedding, upholstered furniture and  carpeting. . Because so much time is spent in the bedroom, it is essential to reduce mite levels there.  . Encase pillows, mattresses, and box springs in special allergen-proof fabric covers or airtight, zippered plastic covers.  . Bedding should be washed weekly in hot water (130 F) and dried in a hot dryer. Allergen-proof covers are available for comforters and pillows that can't be regularly washed.  Reyes Ivan the allergy-proof covers every few months. Minimize clutter in the bedroom. Keep pets out of the bedroom.  Marland Kitchen Keep humidity less than 50% by using a dehumidifier or air conditioning. You can buy a humidity measuring device called a hygrometer to monitor this.  . If possible, replace carpets with hardwood, linoleum, or washable area rugs. If that's not possible, vacuum frequently with a vacuum that has a HEPA filter. . Remove all upholstered furniture and non-washable window drapes from the bedroom. . Remove all non-washable stuffed toys from the bedroom.  Wash stuffed toys weekly.

## 2020-12-10 NOTE — Progress Notes (Signed)
New Patient Note  RE: Angela May MRN: [id number] DOB: 12/05/1975 Date of Office Visit: 12/10/2020  Referring provider: Jarome Matin, MD Primary care provider: Jarome Matin, MD  Chief Complaint: Allergies and Allergy Testing  History of Present Illness: I had the pleasure of seeing Angela May for initial evaluation at the [facility] on 12/10/2020. She is a 45 y.o. female, who is referred here by Jarome Matin, MD for the evaluation of allergy testing.  She reports symptoms of sneezing, coughing, nasal congestion, itchy/watery eyes, PND, itchy rash. Symptoms have been going on for 20 years but worse since they got a new dog. Patient had a dog that passed way in 06-Feb-2017. The symptoms are present all year around with worsening in spring. Other triggers include exposure to dog. Anosmia: no. Headache: sometimes. She has used zyrtec, allegra, Singulair, Flonase with some improvement in symptoms. Sinus infections: no. Previous work up includes: none. Previous ENT evaluation: no. Last eye exam: within the past year. History of reflux: no.  No prior history of asthma. Patient took a few days of prednisone with no benefit. Sometimes has coughing and no inhaler use.   Assessment and Plan: Angela May is a 45 y.o. female with: Other allergic rhinitis Perennial rhinoconjunctivitis symptoms for 20 years but worse since she got a new dog a few months ago. Interestingly her last dog passed away in 02/06/17 and had no allergies around it. Currently using Singulair with good benefit. 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 No prior allergy testing.  Today's skin prick testing showed: Positive to grass, ragweed, weed, trees, dust mites, cat, horse. Borderline to [place] pig. Results given.  Start environmental control measures as below.  May use over the counter antihistamines such as Zyrtec (cetirizine), Claritin (loratadine), Allegra (fexofenadine), or Xyzal (levocetirizine) daily as  needed. May take twice a day during flares.  Continue Singulair (montelukast) 10mg  daily at night. Cautioned that in some children/adults can experience behavioral changes including hyperactivity, agitation, depression, sleep disturbances and suicidal ideations. These side effects are rare, but if you notice them you should notify me and discontinue Singulair (montelukast).  Read about allergy injections - handout given.  May use Flonase (fluticasone) nasal spray 1 spray per nostril twice a day as needed for nasal congestion.   May use azelastine eye drops 0.05% twice a day as needed for itchy/watery eyes.  Return for intradermal testing - patient had another time commitment and could not do intradermal testing today.  Allergic conjunctivitis of both eyes  See assessment and plan as above for allergic rhinitis.  Coughing Noted some increased coughing the last few months with the new dog. Denies any history of asthma or albuterol use.  Today's spirometry was normal.  Monitor symptoms.  May use albuterol rescue inhaler 2 puffs or nebulizer every 4 to 6 hours as needed for shortness of breath, chest tightness, coughing, and wheezing. Monitor frequency of use.   Return in about 2 months (around 02/07/2021) for Skin testing.  Meds ordered this encounter  Medications  . fluticasone (FLONASE) 50 MCG/ACT nasal spray    Sig: Place 1 spray into both nostrils 2 (two) times daily as needed for allergies or rhinitis.    Dispense:  16 g    Refill:  5  . azelastine (OPTIVAR) 0.05 % ophthalmic solution    Sig: Place 1 drop into both eyes 2 (two) times daily as needed (itchy/watery eyes).    Dispense:  6 mL    Refill:  5  Lab Orders  No laboratory test(s) ordered today    Other allergy screening: Food allergy:   Patient has celiac disease Medication allergy: no Hymenoptera allergy: no Urticaria: no Eczema:no History of recurrent infections suggestive of immunodeficency:  no  Diagnostics: Spirometry:  Tracings reviewed. Her effort: Good reproducible efforts. FVC: 3.75L FEV1: 3.06L, 91% predicted FEV1/FVC ratio: 82% Interpretation: Spirometry consistent with normal pattern.  Please see scanned spirometry results for details.  Skin Testing: Environmental allergy panel. Positive to grass, ragweed, weed, trees, dust mites, cat, horse. Borderline to [place] pig.  Results discussed with patient/family.  Airborne Adult Perc - 12/10/20 1004    Time Antigen Placed 1004    Allergen Manufacturer Waynette Buttery    Location Back    Number of Test 59    1. Control-Buffer 50% Glycerol Negative    2. Control-Histamine 1 mg/ml 2+    3. Albumin saline Negative    4. Bahia 4+    5. [place] 3+    6. Johnson 3+    7. Kentucky Blue 4+    8. Meadow Fescue 4+    9. Perennial Rye 4+    10. Sweet Vernal 4+    11. Timothy 4+    12. Cocklebur Negative    13. Burweed Marshelder Negative    14. Ragweed, short 3+    15. Ragweed, Giant Negative    16. Plantain,  English 2+    17. Lamb's Quarters Negative    18. Sheep Sorrell Negative    19. Rough Pigweed Negative    20. Marsh Elder, Rough Negative    21. Mugwort, Common Negative    22. Ash mix Negative    23. Birch mix Negative    24. Beech American Negative    25. Box, Elder Negative    26. Cedar, red Negative    27. Cottonwood, [place] Negative    28. Elm mix Negative    29. Hickory 3+    30. Maple mix Negative    31. Oak, [place] mix Negative    32. Pecan Pollen Negative    33. Pine mix Negative    34. Sycamore Eastern Negative    35. Walnut, Black Pollen Negative    36. Alternaria alternata Negative    37. Cladosporium Herbarum Negative    38. Aspergillus mix Negative    39. Penicillium mix Negative    40. Bipolaris sorokiniana (Helminthosporium) Negative    41. Drechslera spicifera (Curvularia) Negative    42. Mucor plumbeus Negative    43. Fusarium moniliforme Negative    44. Aureobasidium pullulans  (pullulara) Negative    45. Rhizopus oryzae Negative    46. Botrytis cinera Negative    47. Epicoccum nigrum Negative    48. Phoma betae Negative    49. Candida Albicans Negative    50. Trichophyton mentagrophytes Negative    51. Mite, D Farinae  5,000 AU/ml 4+    52. Mite, D Pteronyssinus  5,000 AU/ml 4+    53. Cat Hair 10,000 BAU/ml 3+    54.  Dog Epithelia Negative    55. Mixed Feathers Negative    56. Horse Epithelia 2+    57. Cockroach, German Negative    58. Mouse Negative    59. Tobacco Leaf Negative    Other --   +/-          Past Medical History: Patient Active Problem List   Diagnosis Date Noted  . Coughing 12/10/2020  . Other allergic rhinitis 12/10/2020  . Allergic  conjunctivitis of both eyes 12/10/2020  . Celiac disease 12/14/2016   Past Medical History:  Diagnosis Date  . Celiac disease    DX 2009   Past Surgical History: Past Surgical History:  Procedure Laterality Date  . ABDOMINAL HYSTERECTOMY     Medication List:  Current Outpatient Medications  Medication Sig Dispense Refill  . azelastine (OPTIVAR) 0.05 % ophthalmic solution Place 1 drop into both eyes 2 (two) times daily as needed (itchy/watery eyes). 6 mL 5  . benzonatate (TESSALON) 100 MG capsule Take 1-2 capsules (100-200 mg total) by mouth 3 (three) times daily as needed for cough. 40 capsule 0  . fluticasone (FLONASE) 50 MCG/ACT nasal spray Place 1 spray into both nostrils 2 (two) times daily as needed for allergies or rhinitis. 16 g 5  . LORazepam (ATIVAN) 0.5 MG tablet Take 0.5 mg by mouth every 8 (eight) hours. Reported on 11/06/2015    . montelukast (SINGULAIR) 10 MG tablet SMARTSIG:1 Tablet(s) By Mouth Every Evening    . Norethindrone-Ethinyl Estradiol-Fe Biphas (LO LOESTRIN FE) 1 MG-10 MCG / 10 MCG tablet Take 1 tablet by mouth daily. 3 Package 4  . propranolol (INDERAL) 10 MG tablet TAKE 1/2-1 TABLET BY MOUTH DAILY WITH FOOD AS NEEDED FOR ANXIETY    . topiramate (TOPAMAX) 25 MG tablet  Take 25 mg by mouth 2 (two) times daily.    Marland Kitchen. albuterol (PROVENTIL HFA;VENTOLIN HFA) 108 (90 Base) MCG/ACT inhaler Inhale 2 puffs into the lungs every 4 (four) hours as needed for wheezing or shortness of breath (cough, shortness of breath or wheezing.). (Patient not taking: Reported on 12/10/2020) 1 Inhaler 1  . amitriptyline (ELAVIL) 10 MG tablet Take 10 mg by mouth at bedtime. (Patient not taking: Reported on 12/10/2020)    . budesonide (ENTOCORT EC) 3 MG 24 hr capsule Take 3 capsules (9 mg total) by mouth daily. (Patient not taking: Reported on 12/10/2020) 90 capsule 3  . Guaifenesin (MUCINEX MAXIMUM STRENGTH) 1200 MG TB12 Take 1 tablet (1,200 mg total) by mouth every 12 (twelve) hours as needed. (Patient not taking: Reported on 12/10/2020) 14 tablet 1  . ipratropium (ATROVENT) 0.03 % nasal spray Place 2 sprays into both nostrils 2 (two) times daily. (Patient not taking: Reported on 12/10/2020) 30 mL 0   No current facility-administered medications for this visit.   Allergies: No Known Allergies Social History: Social History   Socioeconomic History  . Marital status: Married    Spouse name: Not on file  . Number of children: Not on file  . Years of education: Not on file  . Highest education level: Not on file  Occupational History  . Not on file  Tobacco Use  . Smoking status: Never Smoker  . Smokeless tobacco: Never Used  Substance and Sexual Activity  . Alcohol use: Yes  . Drug use: No  . Sexual activity: Not on file  Other Topics Concern  . Not on file  Social History Narrative  . Not on file   Social Determinants of Health   Financial Resource Strain: Not on file  Food Insecurity: Not on file  Transportation Needs: Not on file  Physical Activity: Not on file  Stress: Not on file  Social Connections: Not on file   Lives in a house. Smoking: denies Occupation: Careers information officerpharmaceutical rep  Environmental HistorySurveyor, minerals: Water Damage/mildew in the house: no Carpet in the family room:  no Carpet in the bedroom: yes Heating: gas Cooling: central Pet: yes 1 dog x few months, [place] pig  x 2 yrs  Family History: Family History  Problem Relation Age of Onset  . COPD Father   . Colon cancer Neg Hx   . Stomach cancer Neg Hx    Problem                               Relation Asthma                                   No  Eczema                                No  Food allergy                          No  Allergic rhino conjunctivitis     Mother   Review of Systems  Constitutional: Negative for appetite change, chills, fever and unexpected weight change.  HENT: Positive for congestion and postnasal drip. Negative for rhinorrhea.   Eyes: Positive for itching.  Respiratory: Positive for cough. Negative for chest tightness, shortness of breath and wheezing.   Cardiovascular: Negative for chest pain.  Gastrointestinal: Negative for abdominal pain.  Genitourinary: Negative for difficulty urinating.  Skin: Negative for rash.  Allergic/Immunologic: Positive for environmental allergies.  Neurological: Negative for headaches.   Objective: BP 100/72 (BP Location: Right Arm, Patient Position: Sitting, Cuff Size: Normal)   Pulse 82   Temp 97.6 F (36.4 C) (Temporal)   Resp 18   Ht 5' 8.11" (1.73 m)   Wt 151 lb 12.8 oz (68.9 kg)   SpO2 96%   BMI 23.01 kg/m  Body mass index is 23.01 kg/m. Physical Exam Vitals and nursing note reviewed.  Constitutional:      Appearance: Normal appearance. She is well-developed.  HENT:     Head: Normocephalic and atraumatic.     Right Ear: Tympanic membrane and external ear normal.     Left Ear: Tympanic membrane and external ear normal.     Nose: Nose normal.     Mouth/Throat:     Mouth: Mucous membranes are moist.     Pharynx: Oropharynx is clear.  Eyes:     Conjunctiva/sclera: Conjunctivae normal.  Cardiovascular:     Rate and Rhythm: Normal rate and regular rhythm.     Heart sounds: Normal heart sounds. No murmur heard. No  friction rub. No gallop.   Pulmonary:     Effort: Pulmonary effort is normal.     Breath sounds: Normal breath sounds. No wheezing, rhonchi or rales.  Musculoskeletal:     Cervical back: Neck supple.  Skin:    General: Skin is warm.     Findings: No rash.  Neurological:     Mental Status: She is alert and oriented to person, place, and time.  Psychiatric:        Behavior: Behavior normal.    The plan was reviewed with the patient/family, and all questions/concerned were addressed.  It was my pleasure to see Tammela today and participate in her care. Please feel free to contact me with any questions or concerns.  Sincerely,  Wyline Mood, DO Allergy & Immunology  [facility] office: [phone] [facility] office: [phone]

## 2020-12-10 NOTE — Assessment & Plan Note (Signed)
Perennial rhinoconjunctivitis symptoms for 20 years but worse since she got a new dog a few months ago. Interestingly her last dog passed away in 02/04/2017 and had no allergies around it. Currently using Singulair with good benefit. 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 No prior allergy testing.  Today's skin prick testing showed: Positive to grass, ragweed, weed, trees, dust mites, cat, horse. Borderline to [place] pig. Results given.  Start environmental control measures as below.  May use over the counter antihistamines such as Zyrtec (cetirizine), Claritin (loratadine), Allegra (fexofenadine), or Xyzal (levocetirizine) daily as needed. May take twice a day during flares.  Continue Singulair (montelukast) 10mg  daily at night. Cautioned that in some children/adults can experience behavioral changes including hyperactivity, agitation, depression, sleep disturbances and suicidal ideations. These side effects are rare, but if you notice them you should notify me and discontinue Singulair (montelukast).  Read about allergy injections - handout given.  May use Flonase (fluticasone) nasal spray 1 spray per nostril twice a day as needed for nasal congestion.   May use azelastine eye drops 0.05% twice a day as needed for itchy/watery eyes.  Return for intradermal testing - patient had another time commitment and could not do intradermal testing today.

## 2020-12-10 NOTE — Assessment & Plan Note (Signed)
Noted some increased coughing the last few months with the new dog. Denies any history of asthma or albuterol use.  Today's spirometry was normal.  Monitor symptoms.  May use albuterol rescue inhaler 2 puffs or nebulizer every 4 to 6 hours as needed for shortness of breath, chest tightness, coughing, and wheezing. Monitor frequency of use.

## 2020-12-20 DIAGNOSIS — Z139 Encounter for screening, unspecified: Secondary | ICD-10-CM | POA: Diagnosis not present

## 2021-04-14 DIAGNOSIS — Z23 Encounter for immunization: Secondary | ICD-10-CM | POA: Diagnosis not present

## 2021-04-25 DIAGNOSIS — J9801 Acute bronchospasm: Secondary | ICD-10-CM | POA: Diagnosis not present

## 2021-05-06 DIAGNOSIS — R051 Acute cough: Secondary | ICD-10-CM | POA: Diagnosis not present

## 2021-05-06 DIAGNOSIS — J069 Acute upper respiratory infection, unspecified: Secondary | ICD-10-CM | POA: Diagnosis not present

## 2021-05-23 DIAGNOSIS — Z0184 Encounter for antibody response examination: Secondary | ICD-10-CM | POA: Diagnosis not present

## 2021-09-16 DIAGNOSIS — R82998 Other abnormal findings in urine: Secondary | ICD-10-CM | POA: Diagnosis not present

## 2021-09-16 DIAGNOSIS — R7989 Other specified abnormal findings of blood chemistry: Secondary | ICD-10-CM | POA: Diagnosis not present

## 2021-09-16 DIAGNOSIS — M859 Disorder of bone density and structure, unspecified: Secondary | ICD-10-CM | POA: Diagnosis not present

## 2021-09-16 DIAGNOSIS — Z6822 Body mass index (BMI) 22.0-22.9, adult: Secondary | ICD-10-CM | POA: Diagnosis not present

## 2021-09-16 DIAGNOSIS — Z1231 Encounter for screening mammogram for malignant neoplasm of breast: Secondary | ICD-10-CM | POA: Diagnosis not present

## 2021-09-16 DIAGNOSIS — Z1212 Encounter for screening for malignant neoplasm of rectum: Secondary | ICD-10-CM | POA: Diagnosis not present

## 2021-09-16 DIAGNOSIS — Z01419 Encounter for gynecological examination (general) (routine) without abnormal findings: Secondary | ICD-10-CM | POA: Diagnosis not present

## 2021-10-02 DIAGNOSIS — Z0000 Encounter for general adult medical examination without abnormal findings: Secondary | ICD-10-CM | POA: Diagnosis not present

## 2021-10-02 DIAGNOSIS — K900 Celiac disease: Secondary | ICD-10-CM | POA: Diagnosis not present

## 2022-02-11 ENCOUNTER — Other Ambulatory Visit: Payer: Self-pay | Admitting: Allergy

## 2022-03-03 IMAGING — MG MM DIGITAL DIAGNOSTIC UNILAT*R* W/ TOMO W/ CAD
4 series · 4 of 12 positions shown · non-contrast
Comparison: Previous exam(s).

CLINICAL DATA: Screening recall for a possible right breast mass.

EXAM:
DIGITAL DIAGNOSTIC RIGHT MAMMOGRAM WITH CAD AND TOMO
ULTRASOUND RIGHT BREAST

[R MLO synth-2D]
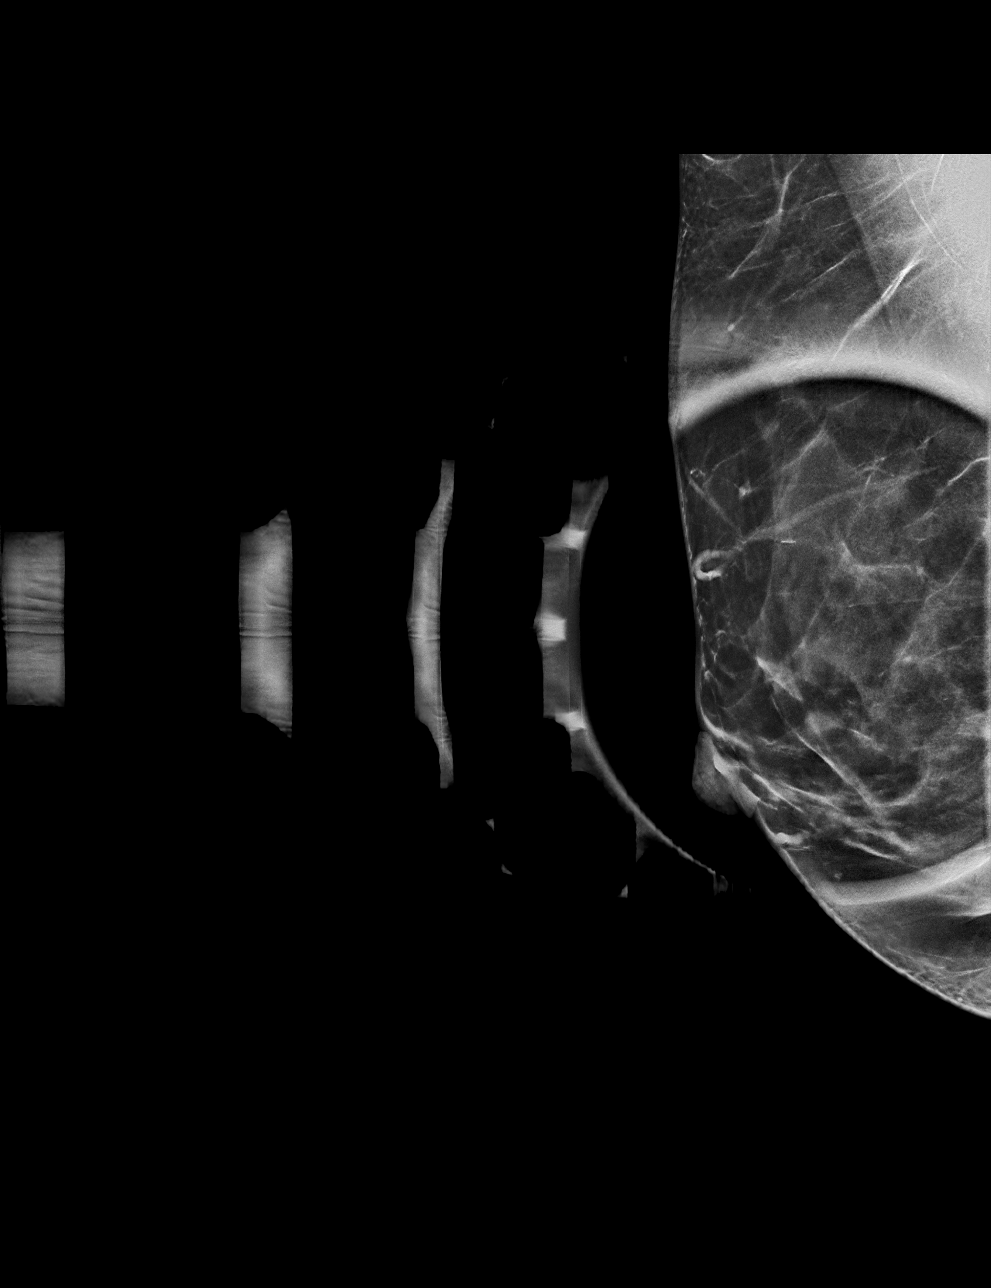

[R ML synth-2D]
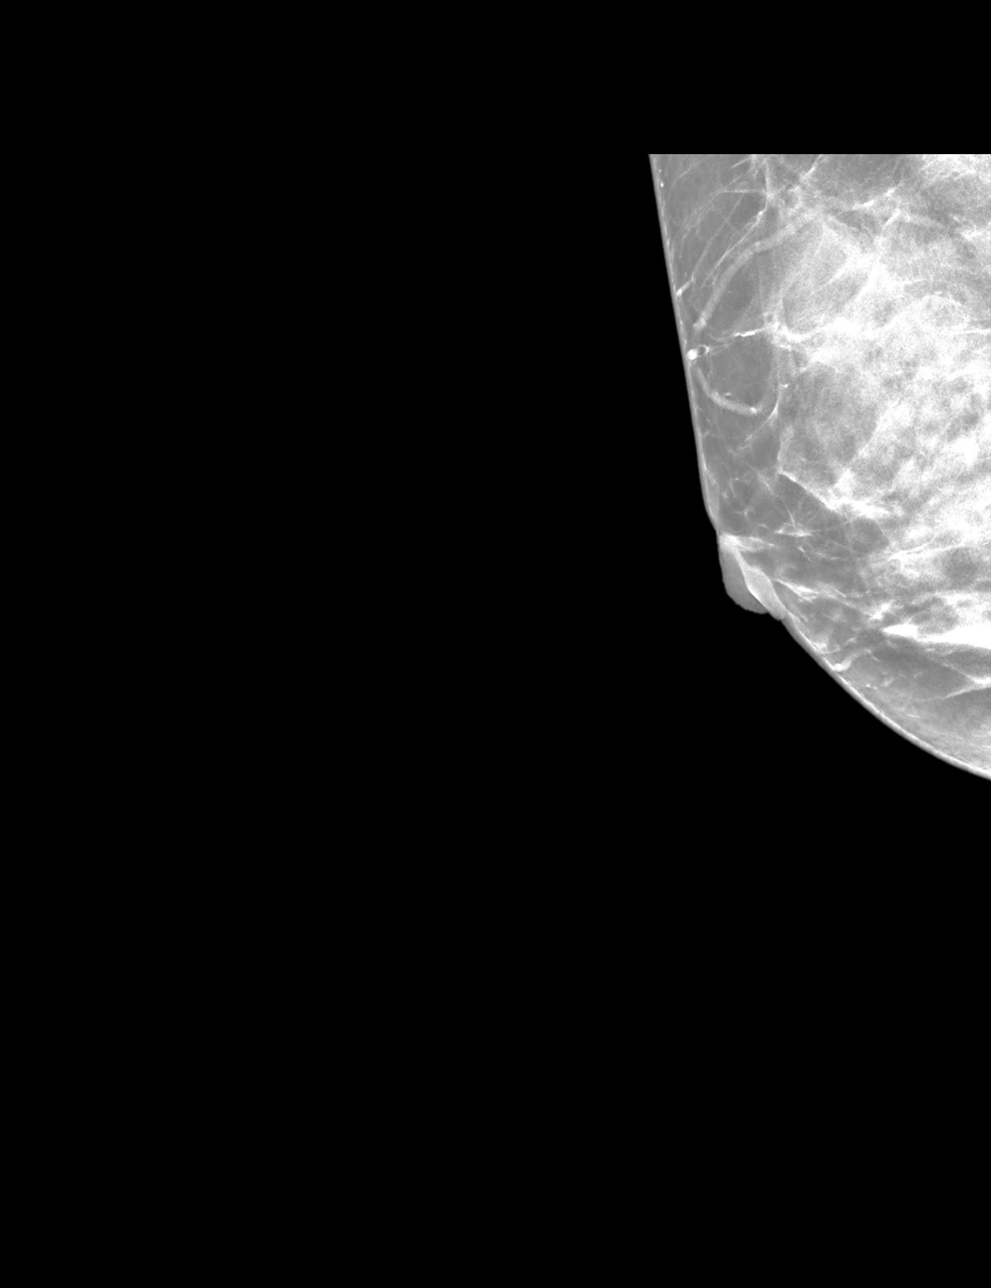

[R MLO tomo · tomo slice 29/57.0]
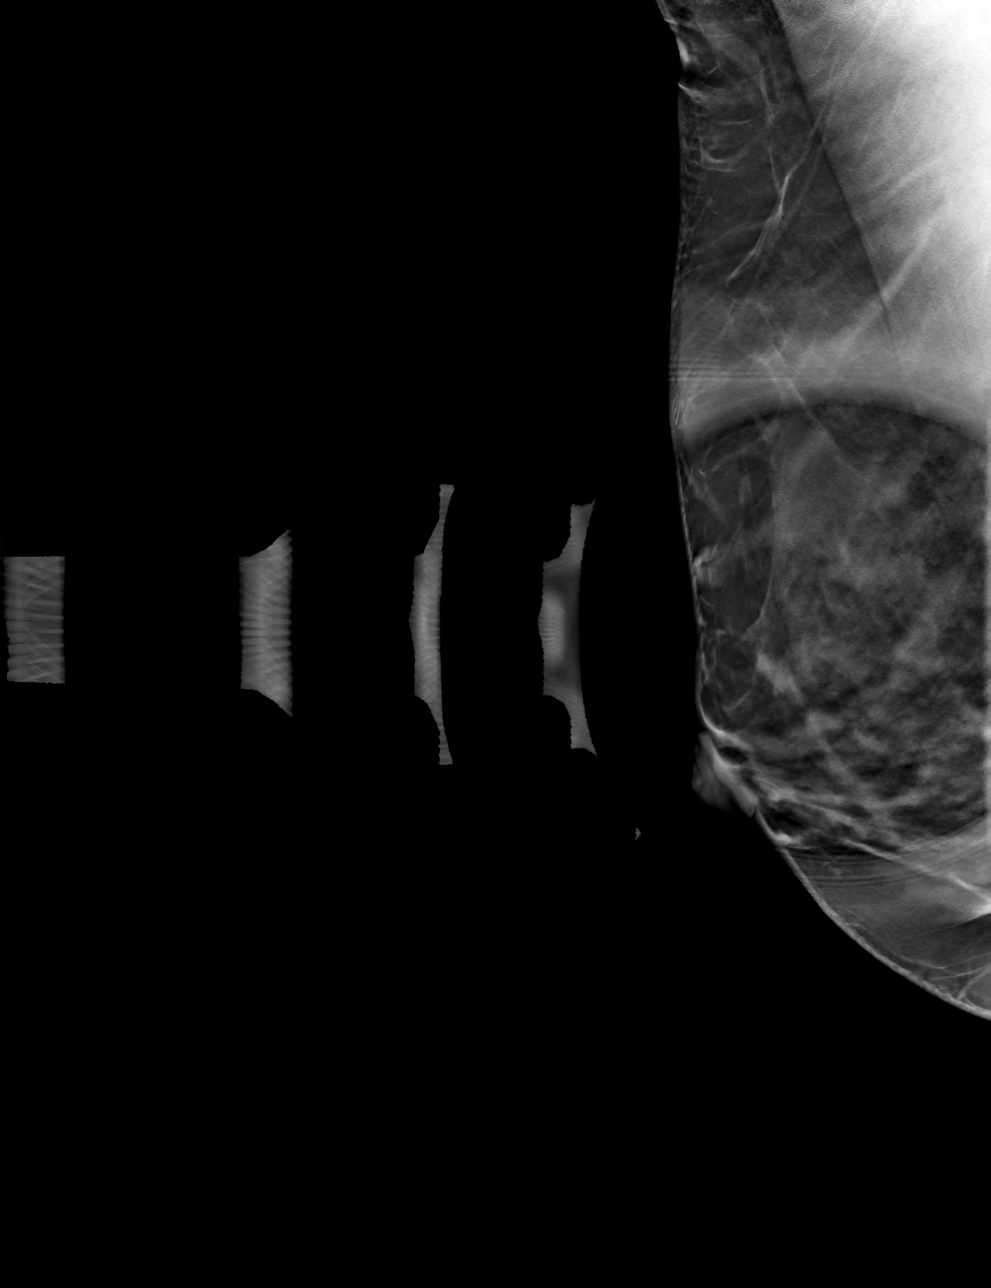

[R ML tomo · tomo slice 29/58.0]
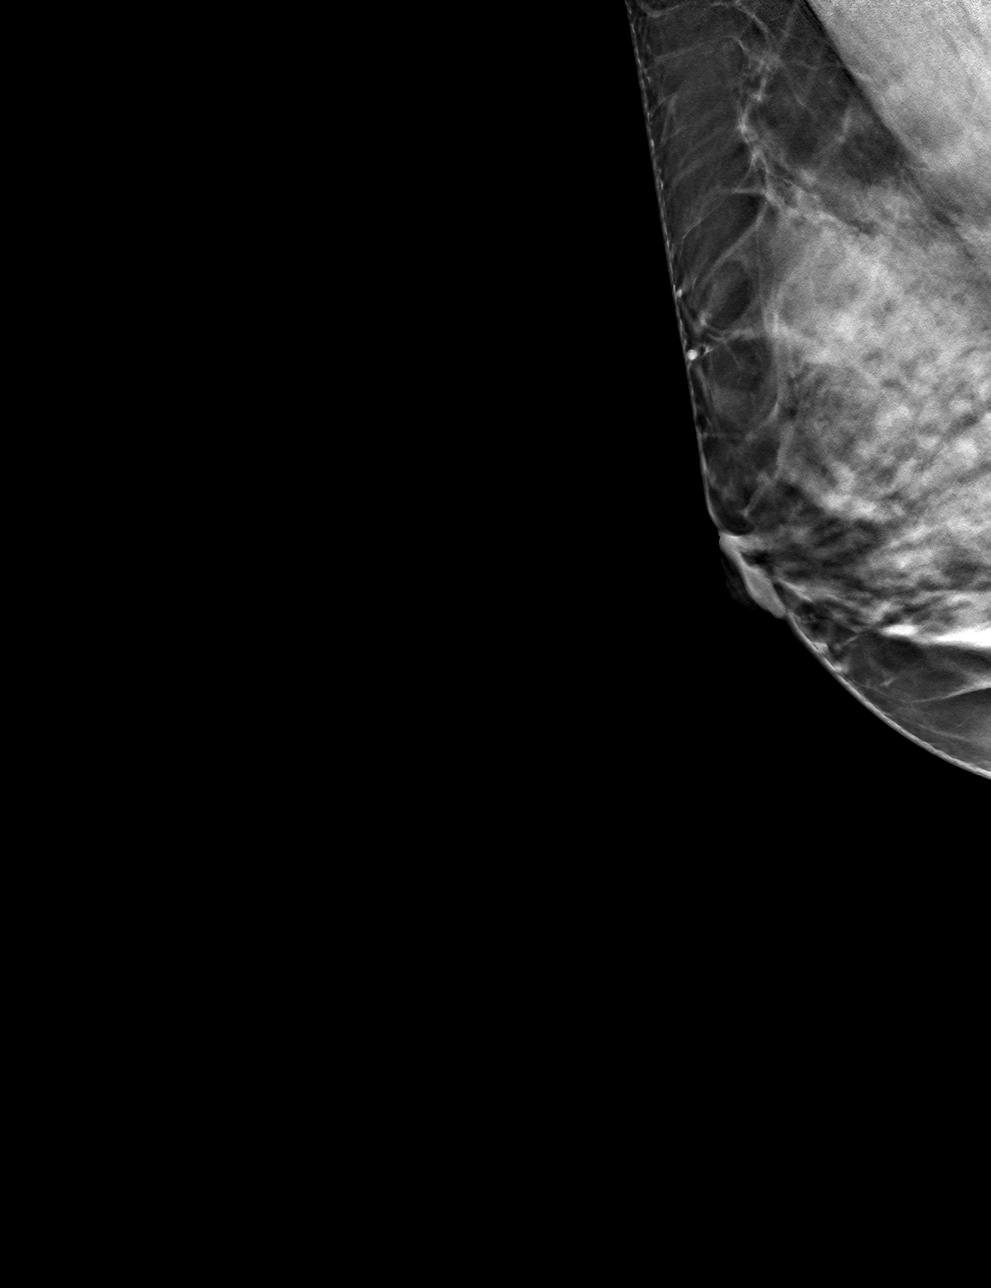

[4 of 12 positions shown; findings below may reference images not displayed]

ACR Breast Density Category d: The breast tissue is extremely dense,
which lowers the sensitivity of mammography.
FINDINGS: Compression tomosynthesis retroareolar right breast demonstrates no
definite masses. However, due to the extreme density of breast
tissue in this region, ultrasound will be performed for further
evaluation.

Mammographic images were processed with CAD.

Ultrasound targeted to the retroareolar right breast demonstrates
normal fibroglandular tissue. No suspicious masses or areas of
shadowing.
IMPRESSION: No persistent mammographic or targeted sonographic abnormalities are
identified in the retroareolar right breast.

RECOMMENDATION:
Screening mammogram in one year.(Code:T4-D-IPX)

I have discussed the findings and recommendations with the patient.
If applicable, a reminder letter will be sent to the patient
regarding the next appointment.

BI-RADS CATEGORY  1: Negative.

## 2022-03-03 IMAGING — US US BREAST*R* LIMITED INC AXILLA
1 series · 4 of 4 positions shown · non-contrast
Comparison: Previous exam(s).

CLINICAL DATA: Screening recall for a possible right breast mass.

EXAM:
DIGITAL DIAGNOSTIC RIGHT MAMMOGRAM WITH CAD AND TOMO
ULTRASOUND RIGHT BREAST

[Series 1: us breast*right* limited inc axilla · 0.06mm/px · 4 of 4 slices shown]
[im 1/4]
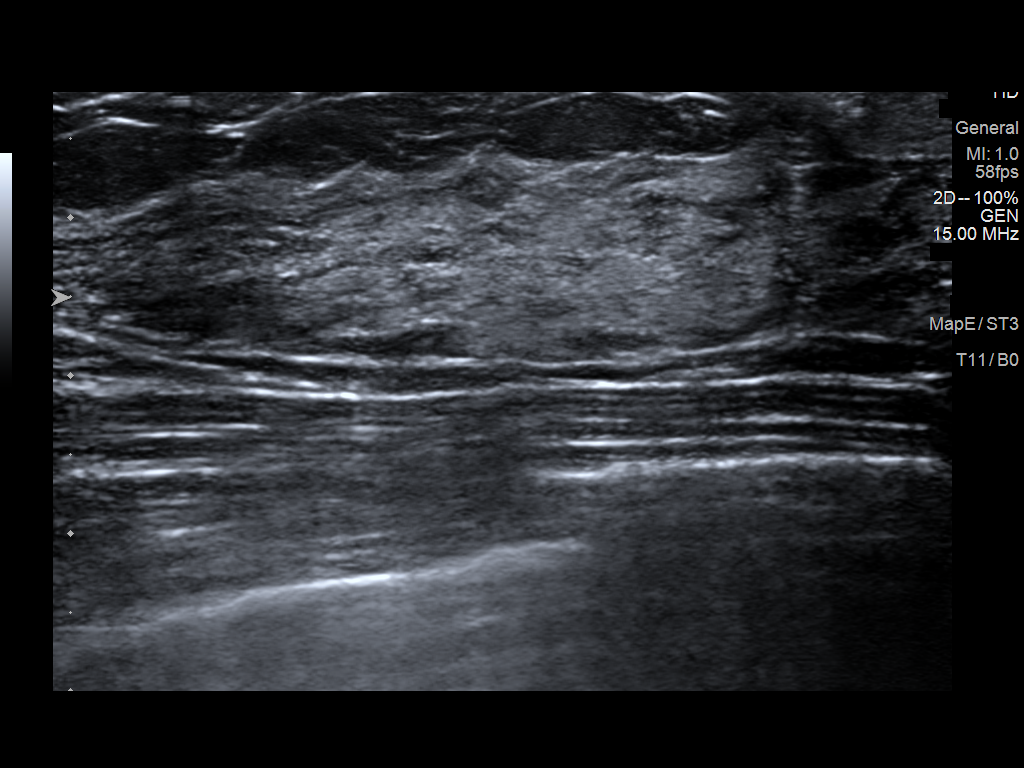
[im 2/4]
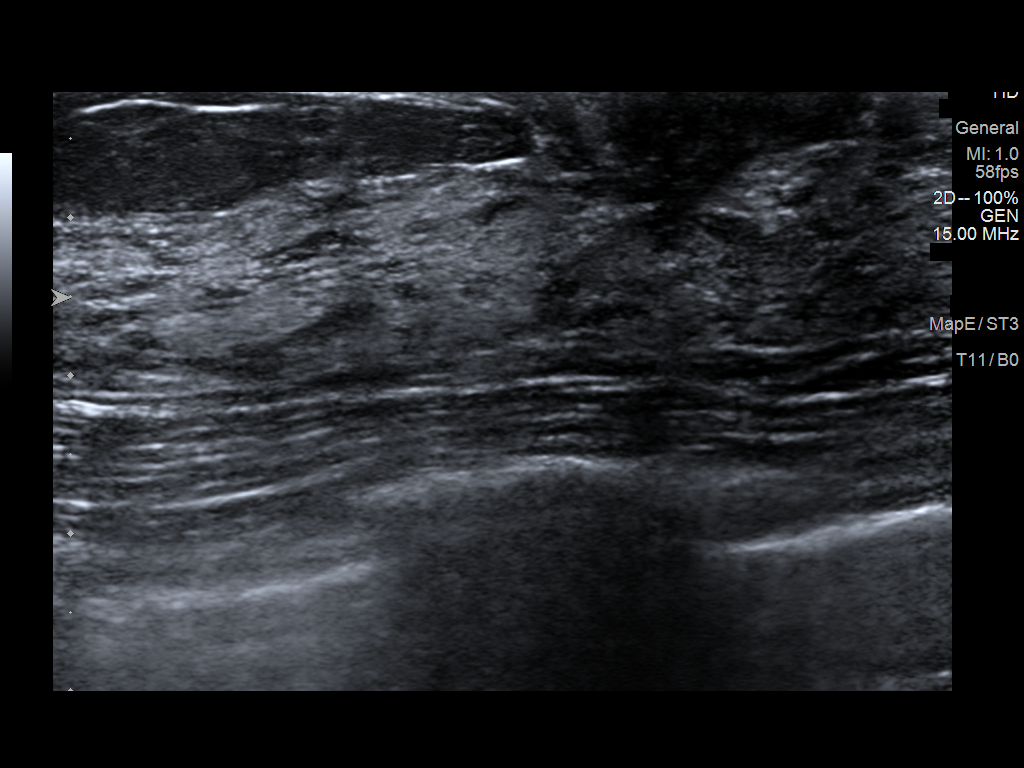
[im 3/4]
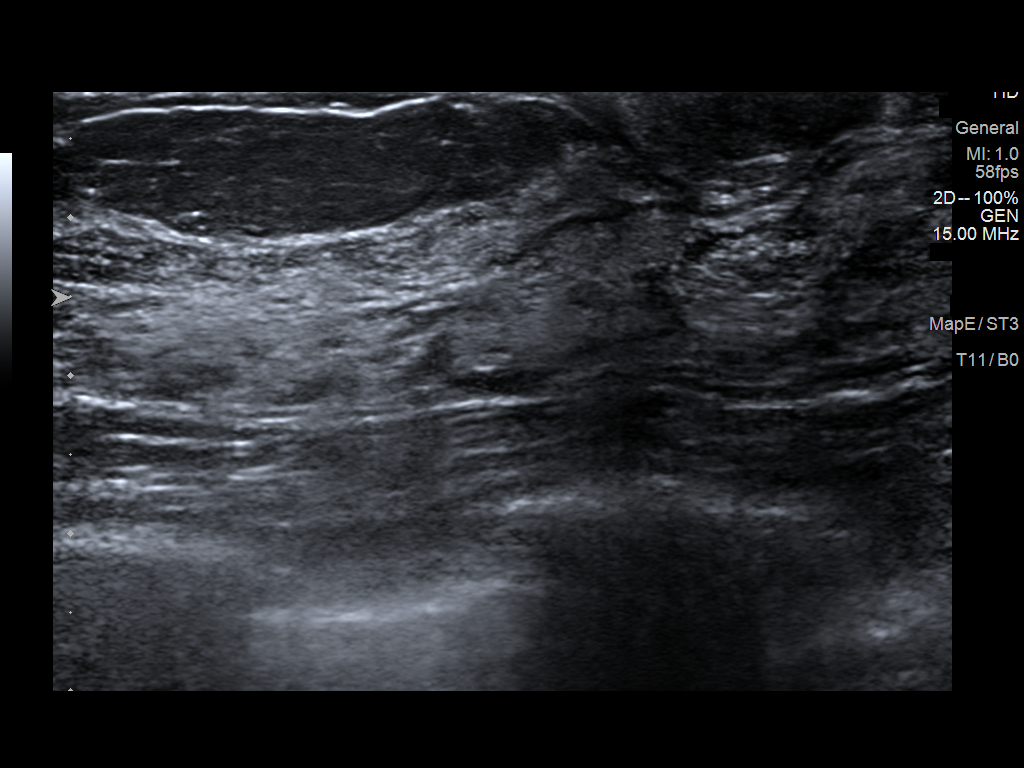
[im 4/4]
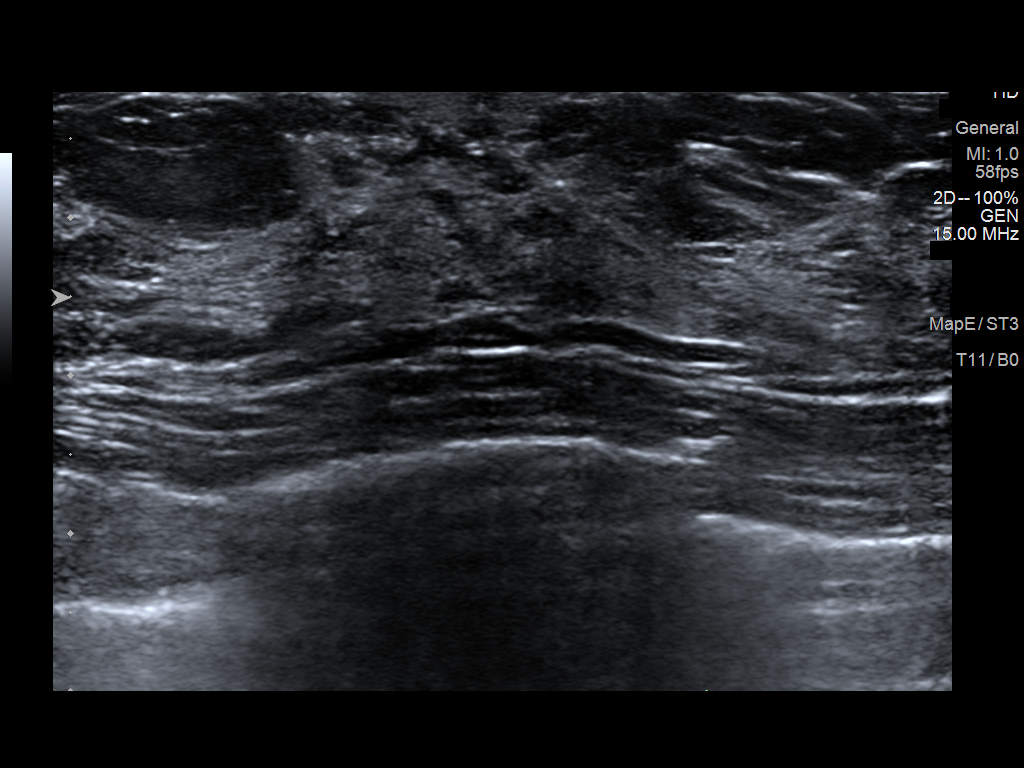

[4 of 4 positions shown; findings below may reference images not displayed]

ACR Breast Density Category d: The breast tissue is extremely dense,
which lowers the sensitivity of mammography.
FINDINGS: Compression tomosynthesis retroareolar right breast demonstrates no
definite masses. However, due to the extreme density of breast
tissue in this region, ultrasound will be performed for further
evaluation.

Mammographic images were processed with CAD.

Ultrasound targeted to the retroareolar right breast demonstrates
normal fibroglandular tissue. No suspicious masses or areas of
shadowing.
IMPRESSION: No persistent mammographic or targeted sonographic abnormalities are
identified in the retroareolar right breast.

RECOMMENDATION:
Screening mammogram in one year.(Code:[id number])

I have discussed the findings and recommendations with the patient.
If applicable, a reminder letter will be sent to the patient
regarding the next appointment.

BI-RADS CATEGORY  1: Negative.

## 2022-03-31 DIAGNOSIS — Z20822 Contact with and (suspected) exposure to covid-19: Secondary | ICD-10-CM | POA: Diagnosis not present

## 2022-03-31 DIAGNOSIS — J069 Acute upper respiratory infection, unspecified: Secondary | ICD-10-CM | POA: Diagnosis not present

## 2022-03-31 DIAGNOSIS — B9689 Other specified bacterial agents as the cause of diseases classified elsewhere: Secondary | ICD-10-CM | POA: Diagnosis not present

## 2022-09-04 DIAGNOSIS — G43909 Migraine, unspecified, not intractable, without status migrainosus: Secondary | ICD-10-CM | POA: Diagnosis not present

## 2022-09-04 DIAGNOSIS — J069 Acute upper respiratory infection, unspecified: Secondary | ICD-10-CM | POA: Diagnosis not present

## 2022-09-21 DIAGNOSIS — Z01419 Encounter for gynecological examination (general) (routine) without abnormal findings: Secondary | ICD-10-CM | POA: Diagnosis not present

## 2022-09-21 DIAGNOSIS — N809 Endometriosis, unspecified: Secondary | ICD-10-CM | POA: Diagnosis not present

## 2022-09-21 DIAGNOSIS — Z1231 Encounter for screening mammogram for malignant neoplasm of breast: Secondary | ICD-10-CM | POA: Diagnosis not present

## 2022-09-21 DIAGNOSIS — Z6823 Body mass index (BMI) 23.0-23.9, adult: Secondary | ICD-10-CM | POA: Diagnosis not present

## 2022-09-21 DIAGNOSIS — E2839 Other primary ovarian failure: Secondary | ICD-10-CM | POA: Diagnosis not present

## 2022-10-27 DIAGNOSIS — Z1211 Encounter for screening for malignant neoplasm of colon: Secondary | ICD-10-CM | POA: Diagnosis not present

## 2022-10-27 DIAGNOSIS — K52832 Lymphocytic colitis: Secondary | ICD-10-CM | POA: Diagnosis not present

## 2022-10-27 DIAGNOSIS — R197 Diarrhea, unspecified: Secondary | ICD-10-CM | POA: Diagnosis not present

## 2022-11-02 DIAGNOSIS — Z1211 Encounter for screening for malignant neoplasm of colon: Secondary | ICD-10-CM | POA: Diagnosis not present

## 2022-11-02 DIAGNOSIS — K2970 Gastritis, unspecified, without bleeding: Secondary | ICD-10-CM | POA: Diagnosis not present

## 2022-11-02 DIAGNOSIS — R194 Change in bowel habit: Secondary | ICD-10-CM | POA: Diagnosis not present

## 2022-11-02 DIAGNOSIS — K6389 Other specified diseases of intestine: Secondary | ICD-10-CM | POA: Diagnosis not present

## 2022-11-02 DIAGNOSIS — R1033 Periumbilical pain: Secondary | ICD-10-CM | POA: Diagnosis not present

## 2022-11-02 DIAGNOSIS — K2950 Unspecified chronic gastritis without bleeding: Secondary | ICD-10-CM | POA: Diagnosis not present

## 2022-11-04 DIAGNOSIS — F419 Anxiety disorder, unspecified: Secondary | ICD-10-CM | POA: Diagnosis not present

## 2022-11-04 DIAGNOSIS — R7989 Other specified abnormal findings of blood chemistry: Secondary | ICD-10-CM | POA: Diagnosis not present

## 2022-11-04 DIAGNOSIS — K900 Celiac disease: Secondary | ICD-10-CM | POA: Diagnosis not present

## 2022-11-24 DIAGNOSIS — Z1339 Encounter for screening examination for other mental health and behavioral disorders: Secondary | ICD-10-CM | POA: Diagnosis not present

## 2022-11-24 DIAGNOSIS — Z1331 Encounter for screening for depression: Secondary | ICD-10-CM | POA: Diagnosis not present

## 2022-11-24 DIAGNOSIS — Z0000 Encounter for general adult medical examination without abnormal findings: Secondary | ICD-10-CM | POA: Diagnosis not present

## 2022-11-24 DIAGNOSIS — R82998 Other abnormal findings in urine: Secondary | ICD-10-CM | POA: Diagnosis not present

## 2022-11-24 DIAGNOSIS — Z23 Encounter for immunization: Secondary | ICD-10-CM | POA: Diagnosis not present

## 2022-11-24 DIAGNOSIS — M5412 Radiculopathy, cervical region: Secondary | ICD-10-CM | POA: Diagnosis not present

## 2022-12-07 DIAGNOSIS — B079 Viral wart, unspecified: Secondary | ICD-10-CM | POA: Diagnosis not present

## 2023-06-16 DIAGNOSIS — F411 Generalized anxiety disorder: Secondary | ICD-10-CM | POA: Diagnosis not present

## 2023-06-16 DIAGNOSIS — Z1331 Encounter for screening for depression: Secondary | ICD-10-CM | POA: Diagnosis not present

## 2023-08-24 DIAGNOSIS — Z0184 Encounter for antibody response examination: Secondary | ICD-10-CM | POA: Diagnosis not present

## 2023-12-10 DIAGNOSIS — Z01419 Encounter for gynecological examination (general) (routine) without abnormal findings: Secondary | ICD-10-CM | POA: Diagnosis not present

## 2023-12-10 DIAGNOSIS — Z1231 Encounter for screening mammogram for malignant neoplasm of breast: Secondary | ICD-10-CM | POA: Diagnosis not present

## 2023-12-10 DIAGNOSIS — Z1331 Encounter for screening for depression: Secondary | ICD-10-CM | POA: Diagnosis not present

## 2023-12-15 DIAGNOSIS — Z1212 Encounter for screening for malignant neoplasm of rectum: Secondary | ICD-10-CM | POA: Diagnosis not present

## 2023-12-15 DIAGNOSIS — R7989 Other specified abnormal findings of blood chemistry: Secondary | ICD-10-CM | POA: Diagnosis not present

## 2023-12-15 DIAGNOSIS — M8580 Other specified disorders of bone density and structure, unspecified site: Secondary | ICD-10-CM | POA: Diagnosis not present

## 2023-12-20 DIAGNOSIS — R82998 Other abnormal findings in urine: Secondary | ICD-10-CM | POA: Diagnosis not present

## 2023-12-20 DIAGNOSIS — Z1339 Encounter for screening examination for other mental health and behavioral disorders: Secondary | ICD-10-CM | POA: Diagnosis not present

## 2023-12-20 DIAGNOSIS — K900 Celiac disease: Secondary | ICD-10-CM | POA: Diagnosis not present

## 2023-12-20 DIAGNOSIS — Z0000 Encounter for general adult medical examination without abnormal findings: Secondary | ICD-10-CM | POA: Diagnosis not present

## 2023-12-20 DIAGNOSIS — Z1331 Encounter for screening for depression: Secondary | ICD-10-CM | POA: Diagnosis not present

## 2023-12-21 DIAGNOSIS — H04123 Dry eye syndrome of bilateral lacrimal glands: Secondary | ICD-10-CM | POA: Diagnosis not present

## 2023-12-21 DIAGNOSIS — H2512 Age-related nuclear cataract, left eye: Secondary | ICD-10-CM | POA: Diagnosis not present

## 2024-03-03 DIAGNOSIS — F419 Anxiety disorder, unspecified: Secondary | ICD-10-CM | POA: Diagnosis not present

## 2024-03-03 DIAGNOSIS — E2839 Other primary ovarian failure: Secondary | ICD-10-CM | POA: Diagnosis not present

## 2024-03-28 DIAGNOSIS — H04123 Dry eye syndrome of bilateral lacrimal glands: Secondary | ICD-10-CM | POA: Diagnosis not present

## 2024-03-31 DIAGNOSIS — Z0184 Encounter for antibody response examination: Secondary | ICD-10-CM | POA: Diagnosis not present

## 2024-07-31 DIAGNOSIS — K900 Celiac disease: Secondary | ICD-10-CM | POA: Diagnosis not present

## 2024-07-31 DIAGNOSIS — Z23 Encounter for immunization: Secondary | ICD-10-CM | POA: Diagnosis not present

## 2024-07-31 DIAGNOSIS — R0781 Pleurodynia: Secondary | ICD-10-CM | POA: Diagnosis not present

## 2024-08-02 DIAGNOSIS — F411 Generalized anxiety disorder: Secondary | ICD-10-CM | POA: Diagnosis not present

## 2024-08-02 DIAGNOSIS — G4700 Insomnia, unspecified: Secondary | ICD-10-CM | POA: Diagnosis not present

## 2024-08-02 DIAGNOSIS — E6609 Other obesity due to excess calories: Secondary | ICD-10-CM | POA: Diagnosis not present
